# Patient Record
Sex: Male | Born: 1980 | Race: Black or African American | Hispanic: No | Marital: Married | Smoking: Never smoker
Health system: Southern US, Community
[De-identification: ages and names within clinical notes are randomized; demographics above are authoritative.]

---

## 2018-06-28 ENCOUNTER — Encounter (HOSPITAL_BASED_OUTPATIENT_CLINIC_OR_DEPARTMENT_OTHER): Payer: Self-pay | Admitting: Emergency Medicine

## 2018-06-28 ENCOUNTER — Emergency Department (HOSPITAL_BASED_OUTPATIENT_CLINIC_OR_DEPARTMENT_OTHER): Payer: BC Managed Care – PPO

## 2018-06-28 ENCOUNTER — Emergency Department (HOSPITAL_BASED_OUTPATIENT_CLINIC_OR_DEPARTMENT_OTHER)
Admission: EM | Admit: 2018-06-28 | Discharge: 2018-06-28 | Disposition: A | Discharge status: Home / Self Care | Payer: BC Managed Care – PPO | Attending: Emergency Medicine | Admitting: Emergency Medicine

## 2018-06-28 ENCOUNTER — Other Ambulatory Visit: Payer: Self-pay

## 2018-06-28 DIAGNOSIS — M7731 Calcaneal spur, right foot: Secondary | ICD-10-CM | POA: Diagnosis not present

## 2018-06-28 DIAGNOSIS — M7660 Achilles tendinitis, unspecified leg: Secondary | ICD-10-CM

## 2018-06-28 DIAGNOSIS — M25571 Pain in right ankle and joints of right foot: Secondary | ICD-10-CM | POA: Insufficient documentation

## 2018-06-28 NOTE — Discharge Instructions (Signed)
Wear your boot at all times except with bathing.  Please follow-up with the orthopedic doctor for further evaluation and treatment.  You can call to make an appointment on Monday.  Use ice 3-4 times daily alternating 20 minutes on, 20 minutes off.  Elevate your leg whenever you are not walking on it.  You can take ibuprofen and Tylenol as prescribed over-the-counter, as needed for your pain.

## 2018-06-28 NOTE — ED Triage Notes (Signed)
Pt c/o R ankle pain since Monday while playing basketball.

## 2018-06-28 NOTE — ED Notes (Signed)
ED Provider at bedside. 

## 2018-06-28 NOTE — ED Provider Notes (Signed)
[facility] EMERGENCY DEPARTMENT Provider Note   CSN: [id number] Arrival date & time: 06/28/18  1029     History   Chief Complaint Chief Complaint  Patient presents with  . Ankle Pain    HPI Charles Barrera is a 37 y.o. male who presents with right posterior ankle pain after playing basketball 6 days ago.  He reports that there was an acceleration injury.  He has been able to bear some weight and has had some improvement over the past 6 days.  He is been using his own crutches at home which has helped his discomfort.  Patient denies any numbness or tingling.  He denies any other injury or pain.  HPI  History reviewed. No pertinent past medical history.  There are no active problems to display for this patient.   History reviewed. No pertinent surgical history.      Home Medications    Prior to Admission medications   Not on File    Family History No family history on file.  Social History Social History   Tobacco Use  . Smoking status: Never Smoker  . Smokeless tobacco: Never Used  Substance Use Topics  . Alcohol use: Yes  . Drug use: Not Currently     Allergies   Patient has no known allergies.   Review of Systems Review of Systems  Musculoskeletal: Positive for arthralgias and joint swelling.  Neurological: Negative for numbness.     Physical Exam Updated Vital Signs BP (!) 128/91 (BP Location: Left Arm)   Pulse (!) 56   Temp 98.6 F (37 C) (Oral)   Resp 19   Ht 6\' 1"  (1.854 m)   Wt 124.7 kg (275 lb)   SpO2 98%   BMI 36.28 kg/m   Physical Exam  Constitutional: He appears well-developed and well-nourished. No distress.  HENT:  Head: Normocephalic and atraumatic.  Eyes: Conjunctivae are normal. Right eye exhibits no discharge. Left eye exhibits no discharge. No scleral icterus.  Neck: Normal range of motion. Neck supple.  Cardiovascular: Normal rate, regular rhythm, normal heart sounds and intact distal pulses. Exam reveals no  gallop and no friction rub.  No murmur heard. Pulmonary/Chest: Effort normal and breath sounds normal. No stridor. No respiratory distress. He has no wheezes. He has no rales.  Musculoskeletal: He exhibits no edema.  Tenderness at the insertion of the Achilles on the left, no deficit noted, Thompson test has normal motion of plantarflexion and produces some pain at the insertion of the Achilles; range of motion of toes intact; DP pulse intact, no malleoli tenderness   Neurological: He is alert. Coordination normal.  Skin: Skin is warm and dry. No rash noted. He is not diaphoretic. No pallor.  Psychiatric: He has a normal mood and affect.  Nursing note and vitals reviewed.    ED Treatments / Results  Labs (all labs ordered are listed, but only abnormal results are displayed) Labs Reviewed - No data to display  EKG None  Radiology Dg Ankle Complete Right  Result Date: 06/28/2018 CLINICAL DATA:  Right ankle pain for several days EXAM: RIGHT ANKLE - COMPLETE 3+ VIEW COMPARISON:  None. FINDINGS: Frontal, oblique, and lateral views were obtained. No evident fracture or joint effusion. No appreciable joint space narrowing or erosion. The ankle mortise appears intact. There is a prominent posterior calcaneal spur.  There is pes planus. IMPRESSION: No evident fracture.  Ankle mortise appears intact. Prominent posterior calcaneal spur.  There is pes planus. Electronically Signed  By: Bretta BangWilliam  Woodruff III M.D.   On: 06/28/2018 10:57    Procedures Procedures (including critical care time)  Medications Ordered in ED Medications - No data to display   Initial Impression / Assessment and Plan / ED Course  I have reviewed the triage vital signs and the nursing notes.  Pertinent labs & imaging results that were available during my care of the patient were reviewed by me and considered in my medical decision making (see chart for details).     Patient presenting with Achilles pain for 6 days.   X-ray of the ankle is negative, but does show a prominent posterior calcaneal spur and patient has tenderness over this area.  Low suspicion of complete Achilles rupture, but will place in cam walker boot and refer to orthopedics for further evaluation.  Patient has his own crutches with him.  Supportive treatment discussed including elevation, NSAIDs, ice.  Patient understands and agrees with plan.  Patient vitals stable throughout ED course and discharged in satisfactory condition.  Final Clinical Impressions(s) / ED Diagnoses   Final diagnoses:  Achilles tendon pain  Calcaneal spur of right foot    ED Discharge Orders    None       Emi HolesLaw, Jevaun Strick M, PA-C 06/28/18 1120    Vanetta MuldersZackowski, Scott, MD 06/28/18 [phone]

## 2018-07-19 ENCOUNTER — Other Ambulatory Visit: Payer: Self-pay

## 2018-07-19 ENCOUNTER — Encounter (HOSPITAL_BASED_OUTPATIENT_CLINIC_OR_DEPARTMENT_OTHER): Payer: Self-pay | Admitting: Emergency Medicine

## 2018-07-19 ENCOUNTER — Emergency Department (HOSPITAL_BASED_OUTPATIENT_CLINIC_OR_DEPARTMENT_OTHER): Payer: BC Managed Care – PPO

## 2018-07-19 ENCOUNTER — Emergency Department (HOSPITAL_BASED_OUTPATIENT_CLINIC_OR_DEPARTMENT_OTHER)
Admission: EM | Admit: 2018-07-19 | Discharge: 2018-07-19 | Disposition: A | Discharge status: Home / Self Care | Payer: BC Managed Care – PPO | Attending: Emergency Medicine | Admitting: Emergency Medicine

## 2018-07-19 DIAGNOSIS — R0789 Other chest pain: Secondary | ICD-10-CM | POA: Diagnosis present

## 2018-07-19 LAB — BASIC METABOLIC PANEL
ANION GAP: 9 (ref 5–15)
BUN: 13 mg/dL (ref 6–20)
CHLORIDE: 106 mmol/L (ref 98–111)
CO2: 26 mmol/L (ref 22–32)
CREATININE: 1.11 mg/dL (ref 0.61–1.24)
Calcium: 9 mg/dL (ref 8.9–10.3)
GFR calc non Af Amer: >60 mL/min (ref >60)
Glucose, Bld: 96 mg/dL (ref 70–99)
POTASSIUM: 3.8 mmol/L (ref 3.5–5.1)
SODIUM: 141 mmol/L (ref 135–145)

## 2018-07-19 LAB — CBC
HCT: 45.2 % (ref 39.0–52.0)
Hemoglobin: 15 g/dL (ref 13.0–17.0)
MCH: 28.8 pg (ref 26.0–34.0)
MCHC: 33.2 g/dL (ref 30.0–36.0)
MCV: 86.8 fL (ref 78.0–100.0)
PLATELETS: 191 10*3/uL (ref 150–400)
RBC: 5.21 MIL/uL (ref 4.22–5.81)
RDW: 13.4 % (ref 11.5–15.5)
WBC: 5.9 10*3/uL (ref 4.0–10.5)

## 2018-07-19 LAB — TROPONIN I

## 2018-07-19 MED ORDER — IBUPROFEN 600 MG PO TABS: 600.0000 mg | ORAL_TABLET | Freq: Three times a day (TID) | ORAL | 0 refills | Status: DC | PRN

## 2018-07-19 MED ORDER — IBUPROFEN 400 MG PO TABS
600.0000 mg | ORAL_TABLET | Freq: Once | ORAL | Status: AC
  Administered 2018-07-19: 600 mg via ORAL
  Filled 2018-07-19: qty 1

## 2018-07-19 NOTE — ED Triage Notes (Signed)
Patient states that he has had right sided chest pain since Thursday - denies any SOB - patient states that it hurts worse with movement

## 2018-07-19 NOTE — Discharge Instructions (Addendum)
If your chest pain worsens or does not improve, or you develop new or worsening symptoms such as fever, shortness of breath, vomiting or any other new/concerning symptoms and return to the ER for evaluation.

## 2018-07-19 NOTE — ED Provider Notes (Signed)
[facility] EMERGENCY DEPARTMENT Provider Note   CSN: [id number] Arrival date & time: 07/19/18  0808     History   Chief Complaint Chief Complaint  Patient presents with  . Chest Pain    HPI Charles Barrera is a 37 y.o. male.  HPI  37 year old male presents with chest pain.  Started 2 days ago.  It is essentially constant.  Feels like a tight and heaviness across his mid chest, a little bit more on the right side.  Worse with certain movements such as rolling in bed or sitting up.  May be a little bit better lying flat.  No pleuritic pain or leg swelling.  No shortness of breath.  Has tried some Bayer aspirin without relief.  Rates the pain is a 5/10. No recent illness.  History reviewed. No pertinent past medical history.  There are no active problems to display for this patient.   History reviewed. No pertinent surgical history.      Home Medications    Prior to Admission medications   Medication Sig Start Date End Date Taking? Authorizing Provider  ibuprofen (ADVIL,MOTRIN) 600 MG tablet Take 1 tablet (600 mg total) by mouth every 8 (eight) hours as needed. 07/19/18   Pricilla Loveless, MD    Family History History reviewed. No pertinent family history.  Social History Social History   Tobacco Use  . Smoking status: Never Smoker  . Smokeless tobacco: Never Used  Substance Use Topics  . Alcohol use: Yes  . Drug use: Not Currently     Allergies   Patient has no known allergies.   Review of Systems Review of Systems  Respiratory: Negative for shortness of breath.   Cardiovascular: Positive for chest pain. Negative for leg swelling.  Gastrointestinal: Negative for abdominal pain and vomiting.  All other systems reviewed and are negative.    Physical Exam Updated Vital Signs BP 116/87   Pulse (!) 56   Temp 98.6 F (37 C) (Oral)   Resp 14   Ht 6\' 1"  (1.854 m)   Wt 124.7 kg (275 lb)   SpO2 99%   BMI 36.28 kg/m   Physical Exam    Constitutional: He is oriented to person, place, and time. He appears well-developed and well-nourished.  HENT:  Head: Normocephalic and atraumatic.  Right Ear: External ear normal.  Left Ear: External ear normal.  Nose: Nose normal.  Eyes: Right eye exhibits no discharge. Left eye exhibits no discharge.  Neck: Neck supple.  Cardiovascular: Normal rate, regular rhythm and normal heart sounds.  Pulmonary/Chest: Effort normal and breath sounds normal. He exhibits tenderness (mild).    Abdominal: Soft. There is no tenderness.  Musculoskeletal: He exhibits no edema.  Neurological: He is alert and oriented to person, place, and time.  Skin: Skin is warm and dry.  Nursing note and vitals reviewed.    ED Treatments / Results  Labs (all labs ordered are listed, but only abnormal results are displayed) Labs Reviewed  BASIC METABOLIC PANEL  CBC  TROPONIN I    EKG EKG Interpretation  Date/Time:  Saturday July 19 2018 08:19:04 EDT Ventricular Rate:  73 PR Interval:    QRS Duration: 92 QT Interval:  389 QTC Calculation: 429 R Axis:   53 Text Interpretation:  Sinus rhythm no acute ST/T changes No old tracing to compare Confirmed by Pricilla Loveless [phone]) on 07/19/2018 8:23:27 AM   Radiology Dg Chest 2 View  Result Date: 07/19/2018 CLINICAL DATA:  Central chest pain for  several days EXAM: CHEST - 2 VIEW COMPARISON:  None. FINDINGS: The heart size and mediastinal contours are within normal limits. Both lungs are clear. The visualized skeletal structures are unremarkable. IMPRESSION: No active cardiopulmonary disease. Electronically Signed   By: Alcide CleverMark  Lukens M.D.   On: 07/19/2018 08:33    Procedures Procedures (including critical care time)    EMERGENCY DEPARTMENT US CARDIAC EXAM "Study: Limited Ultrasound of the Heart and Pericardium"  INDICATIONS:Cardiac arrest Multiple views of the heart and pericardium were obtained in real-time with a multi-frequency probe.  PERFORMED  EA:[id number]:Myself IMAGES ARCHIVED?: Yes LIMITATIONS:  Body habitus VIEWS USED: Parasternal long axis INTERPRETATION: Cardiac activity present, Pericardial effusioin absent and Cardiac tamponade absent   Medications Ordered in ED Medications  ibuprofen (ADVIL,MOTRIN) tablet 600 mg (600 mg Oral Given 07/19/18 0936)     Initial Impression / Assessment and Plan / ED Course  I have reviewed the triage vital signs and the nursing notes.  Pertinent labs & imaging results that were available during my care of the patient were reviewed by me and considered in my medical decision making (see chart for details).     ECG, chest x-ray and labs are reassuring.  I highly doubt ACS, PE, dissection.  He is low risk for PE and PERC neg he does haveative.  Pain that is worse with sitting up and some movements and could be pericarditis although is probably much more likely costochondritis.  Bedside echo does not show any obvious effusion though was limited.  I think is reasonable to treat with ibuprofen and follow-up with a PCP.  Discussed return precautions.  Final Clinical Impressions(s) / ED Diagnoses   Final diagnoses:  Chest wall pain    ED Discharge Orders        Ordered    ibuprofen (ADVIL,MOTRIN) 600 MG tablet  Every 8 hours PRN     07/19/18 1009       Pricilla LovelessGoldston, Yezenia Fredrick, MD 07/19/18 1031

## 2020-04-17 IMAGING — CR DG CHEST 2V
2 series · 2 of 2 positions shown · non-contrast
Comparison: None.

CLINICAL DATA: Central chest pain for several days

EXAM:
CHEST - 2 VIEW

[w chest pa]
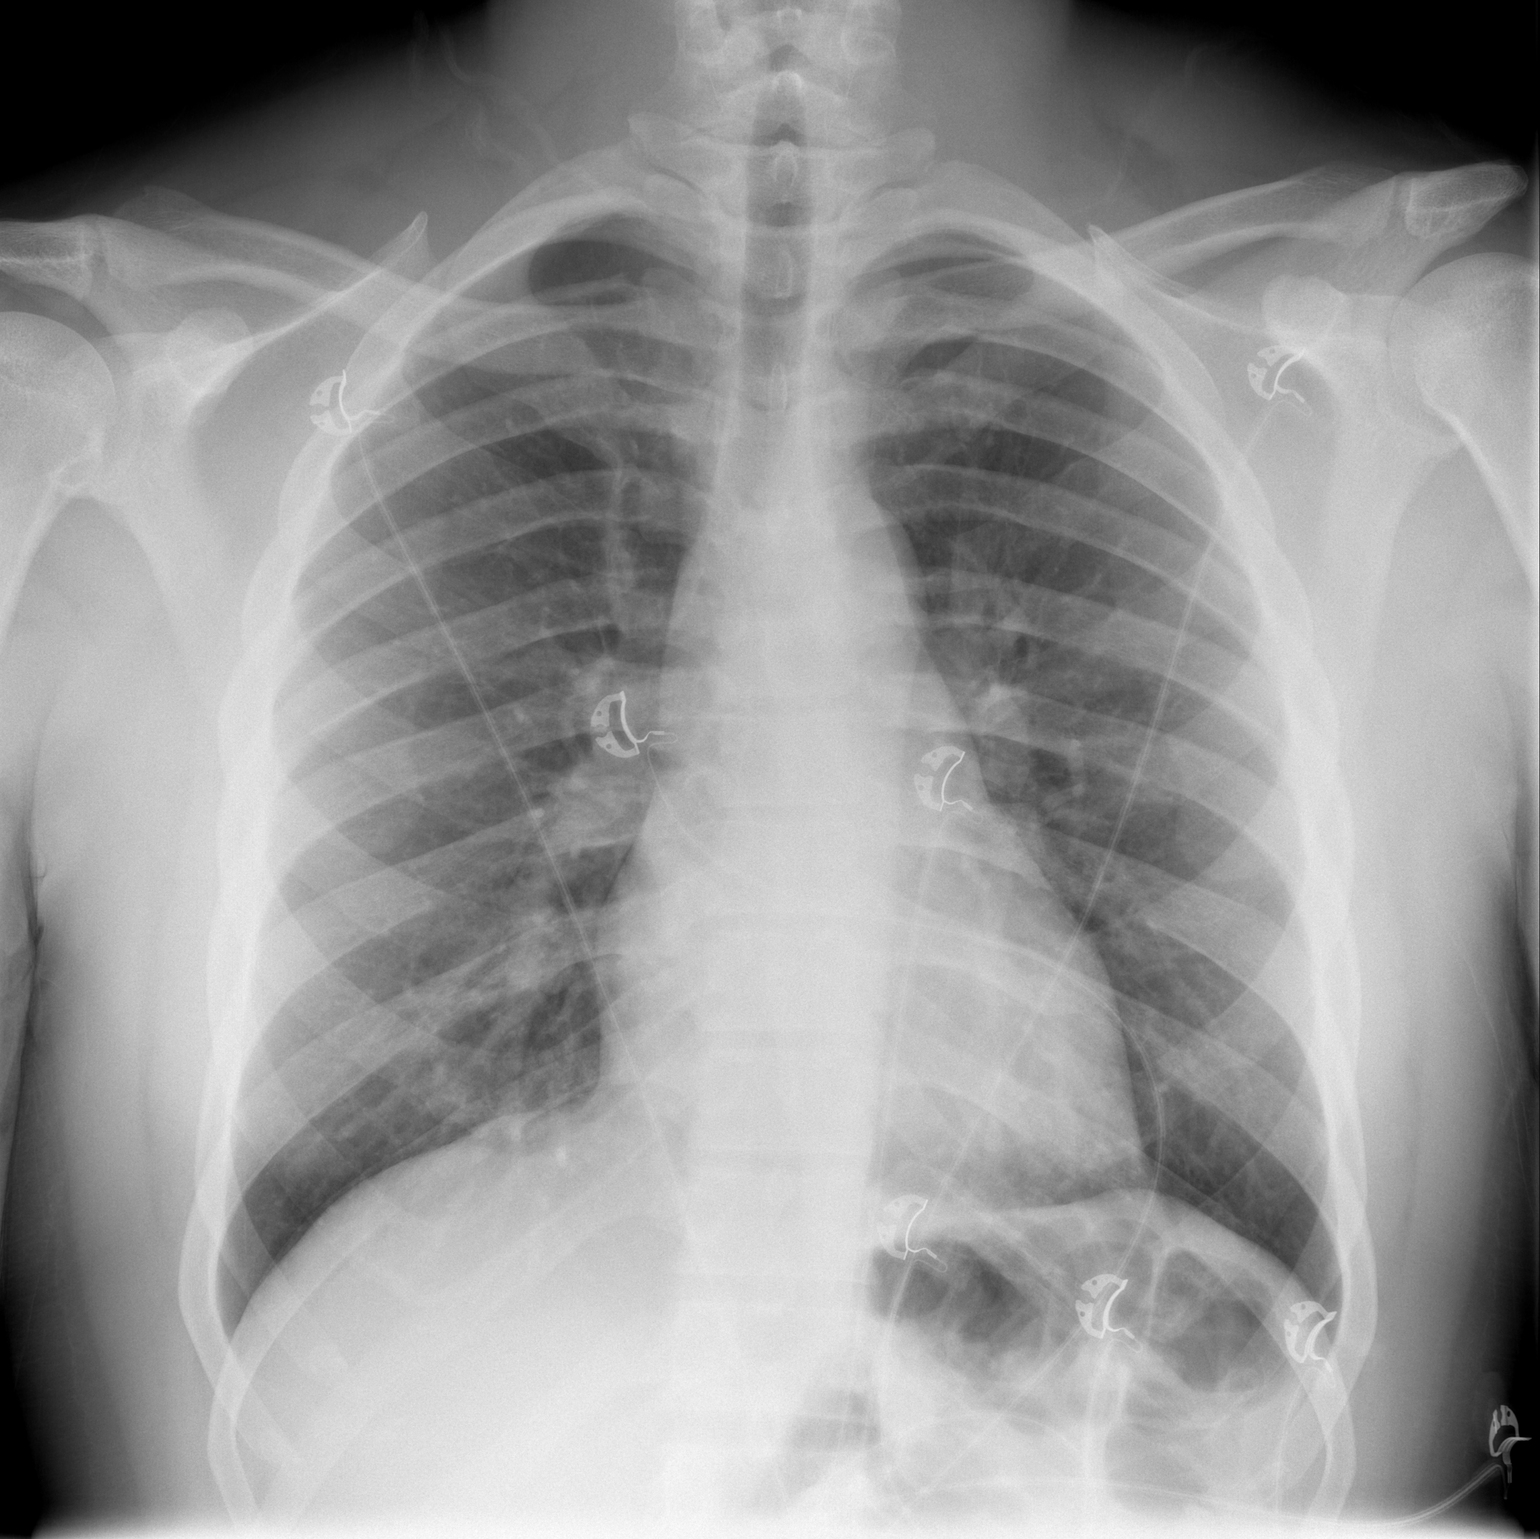

[w chest lat]
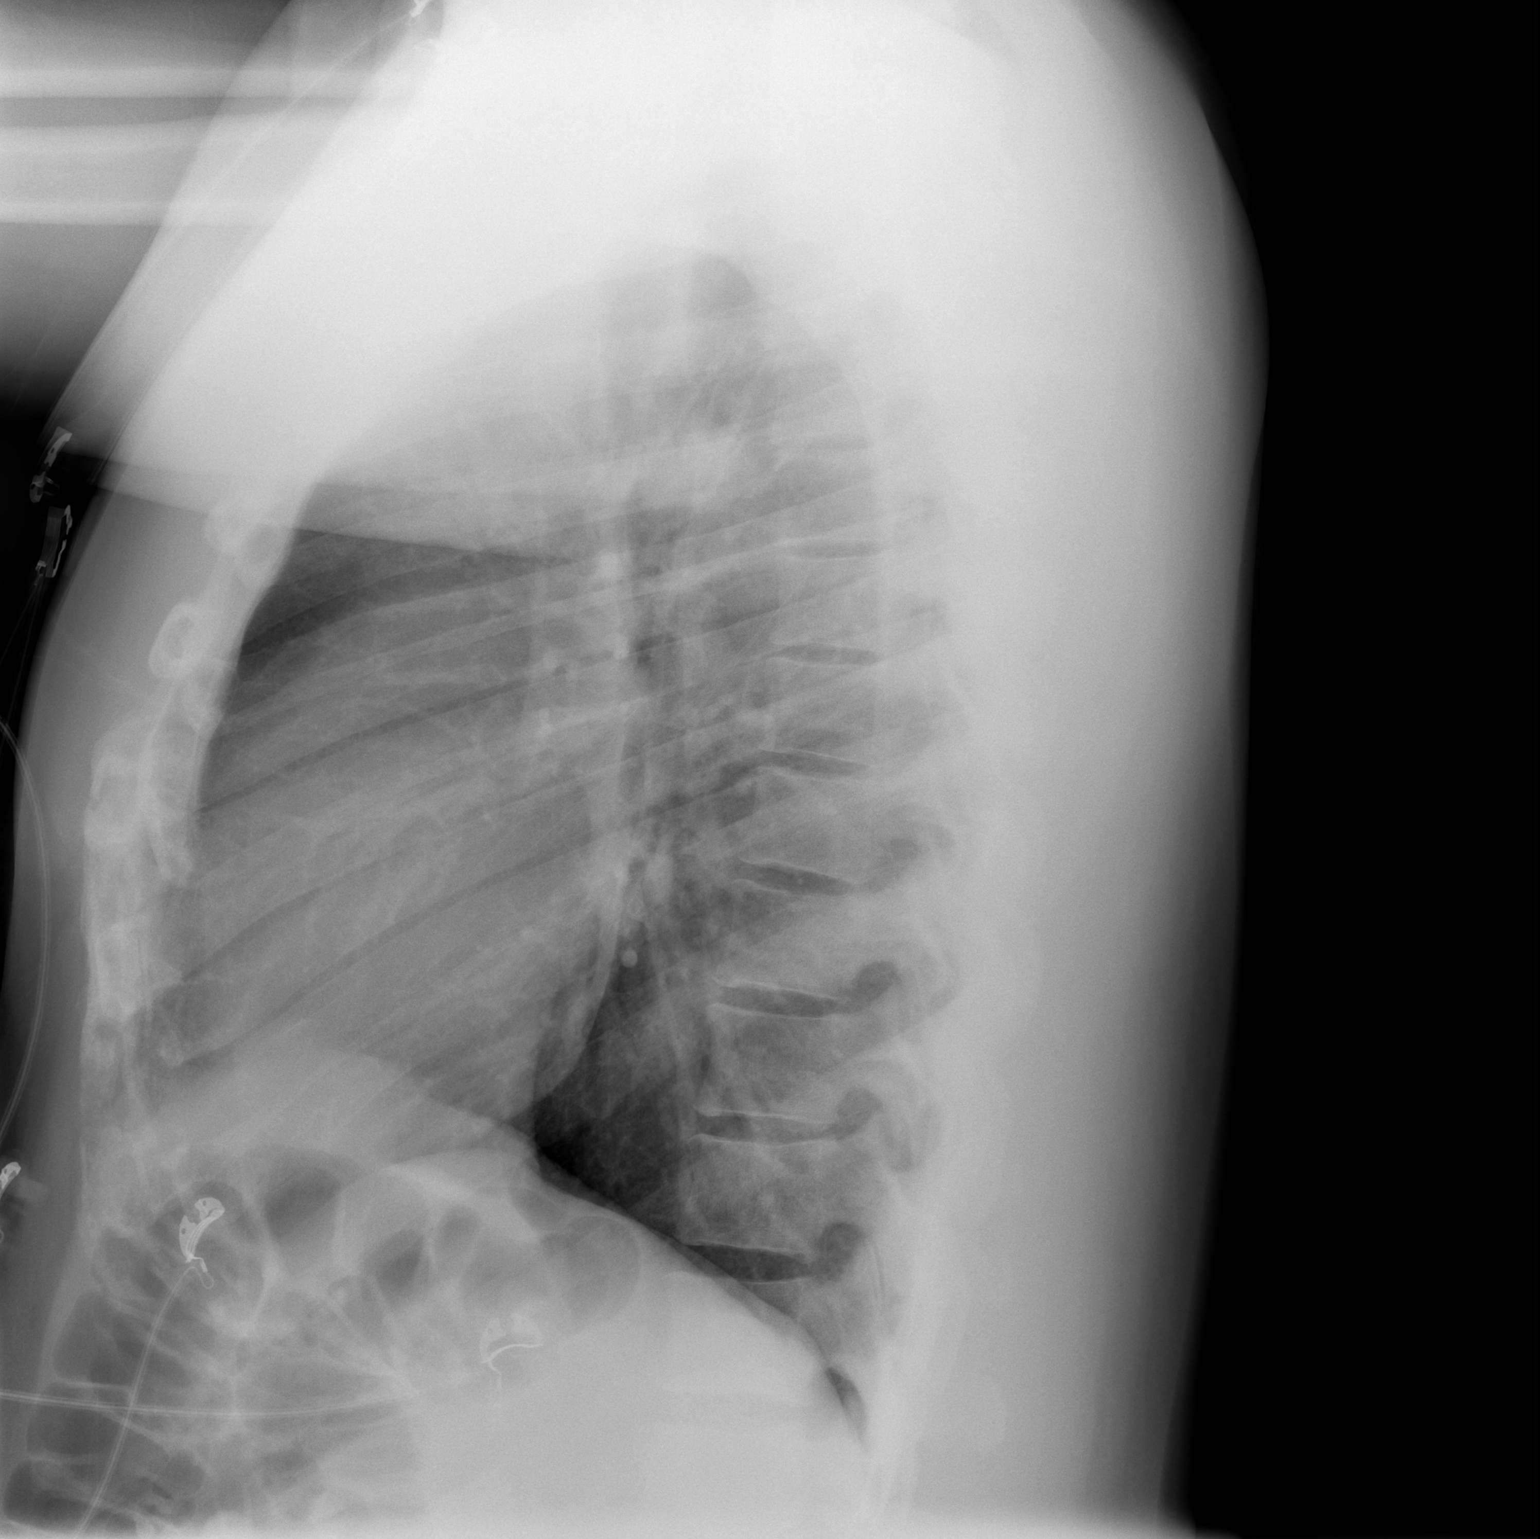

[2 of 2 positions shown; findings below may reference images not displayed]

FINDINGS: The heart size and mediastinal contours are within normal limits.
Both lungs are clear. The visualized skeletal structures are
unremarkable.
IMPRESSION: No active cardiopulmonary disease.

## 2023-10-23 ENCOUNTER — Encounter (HOSPITAL_COMMUNITY): Payer: Self-pay | Admitting: Family Medicine

## 2023-10-23 ENCOUNTER — Emergency Department (HOSPITAL_BASED_OUTPATIENT_CLINIC_OR_DEPARTMENT_OTHER): Payer: BC Managed Care – PPO

## 2023-10-23 ENCOUNTER — Other Ambulatory Visit: Payer: Self-pay

## 2023-10-23 ENCOUNTER — Inpatient Hospital Stay (HOSPITAL_BASED_OUTPATIENT_CLINIC_OR_DEPARTMENT_OTHER)
Admission: EM | Admit: 2023-10-23 | Discharge: 2023-10-28 | DRG: 519 | Disposition: A | Discharge status: Home / Self Care | Payer: BC Managed Care – PPO | Attending: Internal Medicine | Admitting: Internal Medicine

## 2023-10-23 DIAGNOSIS — R933 Abnormal findings on diagnostic imaging of other parts of digestive tract: Secondary | ICD-10-CM | POA: Diagnosis present

## 2023-10-23 DIAGNOSIS — G9529 Other cord compression: Secondary | ICD-10-CM | POA: Diagnosis present

## 2023-10-23 DIAGNOSIS — G9520 Unspecified cord compression: Principal | ICD-10-CM | POA: Diagnosis present

## 2023-10-23 DIAGNOSIS — Z6834 Body mass index (BMI) 34.0-34.9, adult: Secondary | ICD-10-CM

## 2023-10-23 DIAGNOSIS — C7951 Secondary malignant neoplasm of bone: Principal | ICD-10-CM | POA: Diagnosis present

## 2023-10-23 DIAGNOSIS — M899 Disorder of bone, unspecified: Secondary | ICD-10-CM

## 2023-10-23 DIAGNOSIS — M549 Dorsalgia, unspecified: Secondary | ICD-10-CM | POA: Diagnosis present

## 2023-10-23 DIAGNOSIS — R262 Difficulty in walking, not elsewhere classified: Secondary | ICD-10-CM | POA: Diagnosis present

## 2023-10-23 DIAGNOSIS — R531 Weakness: Secondary | ICD-10-CM | POA: Diagnosis present

## 2023-10-23 DIAGNOSIS — G9589 Other specified diseases of spinal cord: Secondary | ICD-10-CM | POA: Diagnosis present

## 2023-10-23 DIAGNOSIS — C801 Malignant (primary) neoplasm, unspecified: Secondary | ICD-10-CM | POA: Diagnosis present

## 2023-10-23 DIAGNOSIS — M898X9 Other specified disorders of bone, unspecified site: Secondary | ICD-10-CM

## 2023-10-23 DIAGNOSIS — E669 Obesity, unspecified: Secondary | ICD-10-CM | POA: Diagnosis present

## 2023-10-23 DIAGNOSIS — Z79899 Other long term (current) drug therapy: Secondary | ICD-10-CM | POA: Diagnosis not present

## 2023-10-23 LAB — BASIC METABOLIC PANEL
Anion gap: 9 (ref 5–15)
BUN: 10 mg/dL (ref 6–20)
CO2: 29 mmol/L (ref 22–32)
Calcium: 9.1 mg/dL (ref 8.9–10.3)
Chloride: 101 mmol/L (ref 98–111)
Creatinine, Ser: 1.08 mg/dL (ref 0.61–1.24)
GFR, Estimated: >60 mL/min (ref >60)
Glucose, Bld: 91 mg/dL (ref 70–99)
Potassium: 3.5 mmol/L (ref 3.5–5.1)
Sodium: 139 mmol/L (ref 135–145)

## 2023-10-23 LAB — CBC WITH DIFFERENTIAL/PLATELET
Abs Immature Granulocytes: 0.01 10*3/uL (ref 0.00–0.07)
Basophils Absolute: 0 10*3/uL (ref 0.0–0.1)
Basophils Relative: 0 %
Eosinophils Absolute: 0.1 10*3/uL (ref 0.0–0.5)
Eosinophils Relative: 1 %
HCT: 42.8 % (ref 39.0–52.0)
Hemoglobin: 14 g/dL (ref 13.0–17.0)
Immature Granulocytes: 0 %
Lymphocytes Relative: 37 %
Lymphs Abs: 1.9 10*3/uL (ref 0.7–4.0)
MCH: 28.5 pg (ref 26.0–34.0)
MCHC: 32.7 g/dL (ref 30.0–36.0)
MCV: 87.2 fL (ref 80.0–100.0)
Monocytes Absolute: 0.4 10*3/uL (ref 0.1–1.0)
Monocytes Relative: 7 %
Neutro Abs: 2.9 10*3/uL (ref 1.7–7.7)
Neutrophils Relative %: 55 %
Platelets: 203 10*3/uL (ref 150–400)
RBC: 4.91 MIL/uL (ref 4.22–5.81)
RDW: 13 % (ref 11.5–15.5)
WBC: 5.3 10*3/uL (ref 4.0–10.5)
nRBC: 0 % (ref 0.0–0.2)

## 2023-10-23 MED ORDER — ACETAMINOPHEN 650 MG RE SUPP: 650.0000 mg | Freq: Four times a day (QID) | RECTAL | Status: DC | PRN

## 2023-10-23 MED ORDER — METHOCARBAMOL 1000 MG/10ML IJ SOLN
500.0000 mg | Freq: Four times a day (QID) | INTRAMUSCULAR | Status: DC | PRN
  Administered 2023-10-26: 500 mg via INTRAVENOUS
  Filled 2023-10-23 (×2): qty 5

## 2023-10-23 MED ORDER — ONDANSETRON HCL 4 MG PO TABS: 4.0000 mg | ORAL_TABLET | Freq: Four times a day (QID) | ORAL | Status: DC | PRN

## 2023-10-23 MED ORDER — OXYCODONE HCL 5 MG PO TABS
5.0000 mg | ORAL_TABLET | ORAL | Status: DC | PRN
  Administered 2023-10-26 – 2023-10-27 (×3): 5 mg via ORAL
  Filled 2023-10-23 (×4): qty 1

## 2023-10-23 MED ORDER — ONDANSETRON HCL 4 MG/2ML IJ SOLN: 4.0000 mg | Freq: Four times a day (QID) | INTRAMUSCULAR | Status: DC | PRN

## 2023-10-23 MED ORDER — GADOBUTROL 1 MMOL/ML IV SOLN
10.0000 mL | Freq: Once | INTRAVENOUS | Status: AC | PRN
  Administered 2023-10-23: 10 mL via INTRAVENOUS

## 2023-10-23 MED ORDER — BISACODYL 5 MG PO TBEC: 5.0000 mg | DELAYED_RELEASE_TABLET | Freq: Every day | ORAL | Status: DC | PRN

## 2023-10-23 MED ORDER — SENNOSIDES-DOCUSATE SODIUM 8.6-50 MG PO TABS: 1.0000 | ORAL_TABLET | Freq: Every evening | ORAL | Status: DC | PRN

## 2023-10-23 MED ORDER — ACETAMINOPHEN 325 MG PO TABS
650.0000 mg | ORAL_TABLET | Freq: Four times a day (QID) | ORAL | Status: DC | PRN
  Filled 2023-10-23: qty 2

## 2023-10-23 MED ORDER — HYDROMORPHONE HCL 1 MG/ML IJ SOLN
0.5000 mg | INTRAMUSCULAR | Status: DC | PRN
  Administered 2023-10-25 – 2023-10-26 (×2): 0.5 mg via INTRAVENOUS
  Filled 2023-10-23 (×2): qty 0.5

## 2023-10-23 NOTE — ED Notes (Signed)
Spoke with Greggory Stallion at [facility] regarding Hospitalist consult.

## 2023-10-23 NOTE — ED Notes (Signed)
[facility] reached out for report to transport patient  Called @ 19:33 ED Nurse will call floor for report

## 2023-10-23 NOTE — H&P (Signed)
History and Physical    Charles Barrera RUE:[id number] DOB: August 08, 1981 DOA: 10/23/2023  PCP: Caffie Damme, MD   Patient coming from: Home   Chief Complaint: Back pain, weakness   HPI: Charles Barrera is a 42 y.o. male who denies any significant past medical history and presents for evaluation of back pain and weakness.  Patient noted development of atraumatic mid back pain in February 2024.  This has progressively worsened and he began developing bilateral lower extremity numbness and weakness in September.  He also has some paresthesias involving his abdominal wall.  [facility] ED Course: Upon arrival to the ED, patient is found to be afebrile and saturating well on room air with normal heart rate and stable blood pressure.  BMP and CBC are unremarkable.  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  Neurosurgery (Dr. Maisie Fus) and oncology (Dr. Pamelia Hoit) were consulted by the ED PA and the patient was transferred to [facility] for admission.  Review of Systems:  All other systems reviewed and apart from HPI, are negative.  History reviewed. No pertinent past medical history.  No past surgical history on file.  Social History:   reports that he has never smoked. He has never used smokeless tobacco. He reports current alcohol use. He reports that he does not currently use drugs.  No Known Allergies  No family history on file.   Prior to Admission medications   Medication Sig Start Date End Date Taking? Authorizing Provider  ibuprofen (ADVIL,MOTRIN) 600 MG tablet Take 1 tablet (600 mg total) by mouth every 8 (eight) hours as needed. 07/19/18   Pricilla Loveless, MD    Physical Exam: Vitals:   10/23/23 2000 10/23/23 2001 10/23/23 2011 10/23/23 2057  BP: 109/78   135/89  Pulse: 63   68  Resp: 16   18  Temp:  98.7 F (37.1  C) 98.7 F (37.1 C) (!) 97.4 F (36.3 C)  TempSrc:  Oral Oral Oral  SpO2: 98%   96%  Weight:      Height:        Constitutional: NAD, calm  Eyes: PERTLA, lids and conjunctivae normal ENMT: Mucous membranes are moist. Posterior pharynx clear of any exudate or lesions.   Neck: supple, no masses  Respiratory: no wheezing, no crackles. No accessory muscle use.  Cardiovascular: S1 & S2 heard, regular rate and rhythm. No extremity edema.  Abdomen: No distension, no tenderness, soft. Bowel sounds active.  Musculoskeletal: no clubbing / cyanosis. No joint deformity upper and lower extremities.   Skin: no significant rashes, lesions, ulcers. Warm, dry, well-perfused. Neurologic: CN 2-12 grossly intact. Sensation to light touch diminished in b/l LEs. Strength 5/5 in all 4 limbs. Alert and oriented.  Psychiatric: Pleasant. Cooperative.    Labs and Imaging on Admission: I have personally reviewed following labs and imaging studies  CBC: Recent Labs  Lab 10/23/23 1434  WBC 5.3  NEUTROABS 2.9  HGB 14.0  HCT 42.8  MCV 87.2  PLT 203   Basic Metabolic Panel: Recent Labs  Lab 10/23/23 1434  NA 139  K 3.5  CL 101  CO2 29  GLUCOSE 91  BUN 10  CREATININE 1.08  CALCIUM 9.1   GFR: Estimated Creatinine Clearance: 124 mL/min (by C-G formula based on SCr of 1.08 mg/dL). Liver Function Tests: No results for input(s): "AST", "ALT", "ALKPHOS", "BILITOT", "PROT", "  ALBUMIN" in the last 168 hours. No results for input(s): "LIPASE", "AMYLASE" in the last 168 hours. No results for input(s): "AMMONIA" in the last 168 hours. Coagulation Profile: No results for input(s): "INR", "PROTIME" in the last 168 hours. Cardiac Enzymes: No results for input(s): "CKTOTAL", "CKMB", "CKMBINDEX", "TROPONINI" in the last 168 hours. BNP (last 3 results) No results for input(s): "PROBNP" in the last 8760 hours. HbA1C: No results for input(s): "HGBA1C" in the last 72 hours. CBG: No results for input(s):  "GLUCAP" in the last 168 hours. Lipid Profile: No results for input(s): "CHOL", "HDL", "LDLCALC", "TRIG", "CHOLHDL", "LDLDIRECT" in the last 72 hours. Thyroid Function Tests: No results for input(s): "TSH", "T4TOTAL", "FREET4", "T3FREE", "THYROIDAB" in the last 72 hours. Anemia Panel: No results for input(s): "VITAMINB12", "FOLATE", "FERRITIN", "TIBC", "IRON", "RETICCTPCT" in the last 72 hours. Urine analysis: No results found for: "COLORURINE", "APPEARANCEUR", "LABSPEC", "PHURINE", "GLUCOSEU", "HGBUR", "BILIRUBINUR", "KETONESUR", "PROTEINUR", "UROBILINOGEN", "NITRITE", "LEUKOCYTESUR" Sepsis Labs: @LABRCNTIP (procalcitonin:4,lacticidven:4) )No results found for this or any previous visit (from the past 240 hour(s)).   Radiological Exams on Admission: MR Lumbar Spine W Wo Contrast  Result Date: 10/23/2023 CLINICAL DATA:  Myelopathy.  Weakness of the lower extremities. EXAM: MRI LUMBAR SPINE WITHOUT AND WITH CONTRAST TECHNIQUE: Multiplanar and multiecho pulse sequences of the lumbar spine were obtained without and with intravenous contrast. CONTRAST:  10mL GADAVIST GADOBUTROL 1 MMOL/ML IV SOLN COMPARISON:  None Available. FINDINGS: Segmentation:  5 lumbar type vertebral bodies. Alignment:  Normal Vertebrae: Metastatic disease affecting all the vertebral bodies throughout the region as well as the sacrum in the iliac bones. No evidence of regional pathologic fracture. No extraosseous tumor in this region. Conus medullaris and cauda equina: Conus extends to the T12 level. Conus and cauda equina appear normal. Paraspinal and other soft tissues: No abnormality seen. Disc levels: Mild non-compressive disc bulges at L4-5 and L5-S1. IMPRESSION: 1. Metastatic disease affecting all the vertebral bodies throughout the region as well as the sacrum and iliac bones. No evidence of regional pathologic fracture. No extraosseous tumor in this region. 2. Mild non-compressive disc bulges at L4-5 and L5-S1. 3. Critical  Value/emergent results were called by telephone at the time of interpretation on 10/23/2023 at 4:51 pm to provider [facility] , who verbally acknowledged these results. Electronically Signed   By: Paulina Fusi M.D.   On: 10/23/2023 16:53   MR THORACIC SPINE W WO CONTRAST  Result Date: 10/23/2023 CLINICAL DATA:  Myelopathy, acute. Bilateral lower extremity weakness. Balance disturbance. Symptoms began over the last few months. EXAM: MRI THORACIC WITHOUT AND WITH CONTRAST TECHNIQUE: Multiplanar and multiecho pulse sequences of the thoracic spine were obtained without and with intravenous contrast. CONTRAST:  10mL GADAVIST GADOBUTROL 1 MMOL/ML IV SOLN COMPARISON:  None Available. FINDINGS: Alignment:  No malalignment. Vertebrae: There is widespread metastatic disease or myeloma throughout the spine. Virtually every level is involved. There is no evidence of regional pathologic fracture. There is an intraspinal mass which appears to be extradural extending from mid T4 to mid T6 in the posterior right side of the spinal canal. This displaces the spinal cord and could be compressing the cord. No other extraosseous tumor seen in the region. Paraspinal and other soft tissues: No para spinous lesion identified. Disc levels: No evidence of disc level pathology. No degenerative disease or herniation. IMPRESSION: 1. 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 Virtually every level is involved. No evidence of regional pathologic fracture. 2. Extraosseous mass in the posterior right side of the spinal  canal extending from mid T4 to mid T6. This displaces the spinal cord and could be compressing the cord. 3. Call report in progress. Electronically Signed   By: Paulina Fusi M.D.   On: 10/23/2023 16:48     Assessment/Plan  1. Metastatic disease; spinal cord compression - Presents with progressive back pain and b/l LE weakness and has MRI findings concerning for metastatic disease leading to spinal  cord compression  - Follow-up neurosurgery and oncology recommendations     DVT prophylaxis: SCDs  Code Status: Full  Level of Care: Level of care: Med-Surg Family Communication: None present   Disposition Plan:  Patient is from: home  Anticipated d/c is to: TBD Anticipated d/c date is: 10/26/23  Patient currently: Pending neurosurgery and oncology consultations  Consults called: Neurosurgery, oncology  Admission status: Inpatient     Briscoe Deutscher, MD [facility]  10/23/2023, 10:05 PM

## 2023-10-23 NOTE — ED Triage Notes (Signed)
C/O back pain since earlier this year; and has been having walking and balance issue since September. Stated he has seen his PCP for same issue and has had imaging but no answers as to what is causing his pain, walking and balance issues.

## 2023-10-23 NOTE — Plan of Care (Signed)

## 2023-10-23 NOTE — ED Provider Notes (Signed)
[facility] Provider Note   CSN: [id number] Arrival date & time: 10/23/23  1358     History  Chief Complaint  Patient presents with   Back Pain    Charles Barrera is a 42 y.o. male.  Patient with no symptom past medical history started having back pain in about February of this year.  After several months he went to see the primary care physician.  He had x-rays performed per his report, that were negative.  He was just taking over-the-counter medications for symptoms.  In September patient started noticing some weakness in his legs bilaterally and some trouble walking.  He went and saw PCP who ordered an MRI (findings as below).  He states that they were concerned about multiple myeloma and ordered lab work which came back as negative.  He states that he also had a CT scan of his body which he reported coming back normal.  He was sent to see a spine doctor and then referred on to a surgeon.  He had that appointment yesterday.  He states that the surgeon stated that because they did not have a definitive diagnosis, that there was nothing that they could do for him immediately.  He presents to the emergency department today needing some answers.  He is concerned that he will end up needing wheelchair because of the progressive nature of his symptoms.  MRI findings 1.  Diffusely abnormal bone marrow in the lumbar spine, sacrum, and imaged bony pelvis with multifocal marrow replacing lesions. Findings raise concern for malignancy which may represent include metastatic disease, myeloma, or other hematologic malignancy. Recommend appropriate laboratory evaluation and further evaluation with CT of the chest, abdomen, and pelvis with contrast.  2.  No findings to suggest pathologic fracture or epidural spread of tumor within limitations of noncontrast exam.  3.  No high grade canal or foraminal stenosis within the lumbar spine.  4.  Mild degenerative disc  disease at L5-S1 with disc bulge and facet hypertrophy contributing to mild narrowing of the subarticular recesses and neural foramina. Disc material may contact the traversing S1 nerve roots without displacement.   These findings were discussed with Pasty Spillers (assistant to Dr. Caffie Damme) by Dr. Chelsea Aus via telephone (facilitated via the radiology critical value line) on 09/28/2023 3:41 PM.         Home Medications Prior to Admission medications   Medication Sig Start Date End Date Taking? Authorizing Provider  ibuprofen (ADVIL,MOTRIN) 600 MG tablet Take 1 tablet (600 mg total) by mouth every 8 (eight) hours as needed. 07/19/18   Pricilla Loveless, MD      Allergies    Patient has no known allergies.    Review of Systems   Review of Systems  Physical Exam Updated Vital Signs BP (!) 141/94 (BP Location: Left Arm)   Pulse 64   Temp 97.7 F (36.5 C)   Resp 18   Ht 6\' 2"  (1.88 m)   Wt 120.2 kg   SpO2 99%   BMI 34.02 kg/m   Physical Exam Vitals and nursing note reviewed.  Constitutional:      General: He is not in acute distress.    Appearance: He is well-developed.  HENT:     Head: Normocephalic and atraumatic.  Eyes:     General:        Right eye: No discharge.        Left eye: No discharge.     Conjunctiva/sclera: Conjunctivae  normal.  Cardiovascular:     Rate and Rhythm: Normal rate and regular rhythm.     Heart sounds: Normal heart sounds.  Pulmonary:     Effort: Pulmonary effort is normal.     Breath sounds: Normal breath sounds.  Abdominal:     Palpations: Abdomen is soft.     Tenderness: There is no abdominal tenderness.  Musculoskeletal:     Cervical back: Normal range of motion and neck supple. No tenderness. Normal range of motion.     Thoracic back: No tenderness.     Lumbar back: No tenderness.  Skin:    General: Skin is warm and dry.  Neurological:     Mental Status: He is alert.     Cranial Nerves: Cranial nerves 2-12 are intact.     Sensory:  Sensation is intact.     Motor: Weakness present.     Deep Tendon Reflexes:     Reflex Scores:      Patellar reflexes are 0 on the right side and 0 on the left side.    Comments: Trace weakness lower extremities bilaterally, he has mild difficulty observed lifting legs into the bed when he lies from a sitting position on the bed side.      ED Results / Procedures / Treatments   Labs (all labs ordered are listed, but only abnormal results are displayed) Labs Reviewed  CBC WITH DIFFERENTIAL/PLATELET  BASIC METABOLIC PANEL    EKG None  Radiology MR Lumbar Spine W Wo Contrast  Result Date: 10/23/2023 CLINICAL DATA:  Myelopathy.  Weakness of the lower extremities. EXAM: MRI LUMBAR SPINE WITHOUT AND WITH CONTRAST TECHNIQUE: Multiplanar and multiecho pulse sequences of the lumbar spine were obtained without and with intravenous contrast. CONTRAST:  10mL GADAVIST GADOBUTROL 1 MMOL/ML IV SOLN COMPARISON:  None Available. FINDINGS: Segmentation:  5 lumbar type vertebral bodies. Alignment:  Normal Vertebrae: Metastatic disease affecting all the vertebral bodies throughout the region as well as the sacrum in the iliac bones. No evidence of regional pathologic fracture. No extraosseous tumor in this region. Conus medullaris and cauda equina: Conus extends to the T12 level. Conus and cauda equina appear normal. Paraspinal and other soft tissues: No abnormality seen. Disc levels: Mild non-compressive disc bulges at L4-5 and L5-S1. IMPRESSION: 1. Metastatic disease affecting all the vertebral bodies throughout the region as well as the sacrum and iliac bones. No evidence of regional pathologic fracture. No extraosseous tumor in this region. 2. Mild non-compressive disc bulges at L4-5 and L5-S1. 3. Critical Value/emergent results were called by telephone at the time of interpretation on 10/23/2023 at 4:51 pm to provider [facility] , who verbally acknowledged these results. Electronically Signed   By: Paulina Fusi M.D.   On: 10/23/2023 16:53   MR THORACIC SPINE W WO CONTRAST  Result Date: 10/23/2023 CLINICAL DATA:  Myelopathy, acute. Bilateral lower extremity weakness. Balance disturbance. Symptoms began over the last few months. EXAM: MRI THORACIC WITHOUT AND WITH CONTRAST TECHNIQUE: Multiplanar and multiecho pulse sequences of the thoracic spine were obtained without and with intravenous contrast. CONTRAST:  10mL GADAVIST GADOBUTROL 1 MMOL/ML IV SOLN COMPARISON:  None Available. FINDINGS: Alignment:  No malalignment. Vertebrae: There is widespread metastatic disease or myeloma throughout the spine. Virtually every level is involved. There is no evidence of regional pathologic fracture. There is an intraspinal mass which appears to be extradural extending from mid T4 to mid T6 in the posterior right side of the spinal canal. This displaces the spinal  cord and could be compressing the cord. No other extraosseous tumor seen in the region. Paraspinal and other soft tissues: No para spinous lesion identified. Disc levels: No evidence of disc level pathology. No degenerative disease or herniation. IMPRESSION: 1. 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 Virtually every level is involved. No evidence of regional pathologic fracture. 2. Extraosseous mass in the posterior right side of the spinal canal extending from mid T4 to mid T6. This displaces the spinal cord and could be compressing the cord. 3. Call report in progress. Electronically Signed   By: Paulina Fusi M.D.   On: 10/23/2023 16:48    Procedures Procedures    Medications Ordered in ED Medications  gadobutrol (GADAVIST) 1 MMOL/ML injection 10 mL (10 mLs Intravenous Contrast Given 10/23/23 1604)    ED Course/ Medical Decision Making/ A&P    Patient seen and examined. History obtained directly from patient. Reviewed available information in epic which is sparse.  Did review MRI results.  Imaging is not available.  Discussed  with Dr. Suezanne Jacquet. Will obtain MRI thoracic and lumbar spine w/wo contrast.   Labs/EKG: Ordered CBC and BMP.  Imaging: Ordered MRI  Medications/Fluids: None ordered  Most recent vital signs reviewed and are as follows: BP (!) 141/94 (BP Location: Left Arm)   Pulse 64   Temp 97.7 F (36.5 C)   Resp 18   Ht 6\' 2"  (1.88 m)   Wt 120.2 kg   SpO2 99%   BMI 34.02 kg/m   Initial impression: Progressive lower extremity weakness  5:56 PM Reassessment performed. Patient appears stable.  I discussed results of MRI with Dr. Karin Golden of radiology.  Recommends neurosurgical consultation.  I discussed case with neurosurgery Esperanza Richters. Plan to admit to medicine, NSG consult.  Reccs heme/onc consult.   Paged heme/onc, spoke with Dr. Pamelia Hoit. Heme/onc to consult.   Paged hospitalist.   Labs personally reviewed and interpreted including: CBC and BMP were normal.  Imaging personally visualized and interpreted including: MRI, t-spine mass, bony disease appreciated.   Reviewed pertinent lab work and imaging with patient at bedside. Questions answered.   Most current vital signs reviewed and are as follows: BP (!) 141/94 (BP Location: Left Arm)   Pulse 64   Temp 97.7 F (36.5 C)   Resp 18   Ht 6\' 2"  (1.88 m)   Wt 120.2 kg   SpO2 99%   BMI 34.02 kg/m   Plan: Admit for NSG consultation, further work-up given progressive lower extremity weakness.   6:43 PM Spoke with Dr. Cyndia Bent, [facility] who accepts for admission.                                  Medical Decision Making Amount and/or Complexity of Data Reviewed Labs: ordered.   Admit for further work-up and management of spinal disease with progressive weakness.           Final Clinical Impression(s) / ED Diagnoses Final diagnoses:  Spinal cord compression [facility])    Rx / DC Orders ED Discharge Orders     None         Renne Crigler, PA-C 10/23/23 1845    Lonell Grandchild, MD 10/24/23 [phone]

## 2023-10-23 NOTE — ED Notes (Addendum)
Attempted to call for a consult to Dr. Pamelia Hoit with Oncology, left voicemail.

## 2023-10-23 NOTE — ED Notes (Signed)
Neuro consult called at 1655. Spoke with Aram Beecham. Dr. Maisie Fus on call and will contact J. Geiple PA-C.

## 2023-10-23 NOTE — ED Notes (Signed)
Report was given to [facility], [place]

## 2023-10-23 NOTE — ED Notes (Signed)
Consult called to Dr. Pamelia Hoit at [phone], transferred to [place], [place].

## 2023-10-23 NOTE — ED Notes (Signed)
Attempted to call for a consult to Dr. Katrinka Blazing with Oncology, left voicemail.

## 2023-10-23 NOTE — Progress Notes (Addendum)
Pt reportedly w/ concern for dx of MM w/ increasing weakness over the last few weeks. MRI T/L spine showing diffuse lesions and extraosseous mass T4-6 w/ potential for cord compression. Recommend transfer to [facility] for further eval/mgmt. NSGY to follow.

## 2023-10-23 NOTE — Progress Notes (Addendum)
Plan of Care Note for accepted transfer   Patient: Charles Barrera MRN: [id number]   DOA: 10/23/2023  Facility requesting transfer: [facility] ED Requesting Provider: Rhea Bleacher, PA-C Reason for transfer:widespread metastatic disease to T and L spine; possible cord compression  Facility course: 42 yo M with no significant past medical hx presented with weakness to lower extremity since September and back pain since February. Went to ED because he felt like he could barely walk. Had workup with PCP at [facility] medical reportedly with blood work for multiple myeloma and a CT chest that was reportedly negative but documentation cannot be seen in Epic system.   On exam, he ambulated with a cane but had difficulty lifting lower extremity into the bed. PA was not able to elicit patella reflexes but denies any saddle anesthesia, no bowel or bladder incontinence.   MRI demonstrated   T-spine 1. 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 Virtually every level is involved. No evidence of regional pathologic fracture. 2. Extraosseous mass in the posterior right side of the spinal canal extending from mid T4 to mid T6. This displaces the spinal cord and could be compressing the cord.  L-spine 1. Metastatic disease affecting all the vertebral bodies throughout the region as well as the sacrum and iliac bones. No evidence of regional pathologic fracture. No extraosseous tumor in this region. 2. Mild non-compressive disc bulges at L4-5 and L5-S1.  PA Consulted neurosurgery Charles Riley, PA-C and they recommend [facility]. No definite intervention was suggested.   Oncology Dr. Pamelia Hoit also consulted and will see. Did not suggest any immediate intervention.    Plan of care: The patient is accepted for admission to Med-surg  unit, at [facility]..  ED PA to continue care of patient when he remains in ED.   Author: Anselm Jungling, DO 10/23/2023  Check  [email or web address] for on-call coverage.  Nursing staff, Please call [facility] Admits & Consults System-Wide number on Amion as soon as patient's arrival, so appropriate admitting provider can evaluate the pt.

## 2023-10-24 DIAGNOSIS — G9520 Unspecified cord compression: Secondary | ICD-10-CM | POA: Diagnosis not present

## 2023-10-24 DIAGNOSIS — C7951 Secondary malignant neoplasm of bone: Secondary | ICD-10-CM | POA: Diagnosis not present

## 2023-10-24 LAB — BASIC METABOLIC PANEL
Anion gap: 10 (ref 5–15)
BUN: 9 mg/dL (ref 6–20)
CO2: 25 mmol/L (ref 22–32)
Calcium: 9.1 mg/dL (ref 8.9–10.3)
Chloride: 105 mmol/L (ref 98–111)
Creatinine, Ser: 1.05 mg/dL (ref 0.61–1.24)
GFR, Estimated: >60 mL/min (ref >60)
Glucose, Bld: 108 mg/dL — ABNORMAL HIGH (ref 70–99)
Potassium: 3.7 mmol/L (ref 3.5–5.1)
Sodium: 140 mmol/L (ref 135–145)

## 2023-10-24 LAB — CBC
HCT: 44.1 % (ref 39.0–52.0)
Hemoglobin: 14.2 g/dL (ref 13.0–17.0)
MCH: 28 pg (ref 26.0–34.0)
MCHC: 32.2 g/dL (ref 30.0–36.0)
MCV: 87 fL (ref 80.0–100.0)
Platelets: 202 10*3/uL (ref 150–400)
RBC: 5.07 MIL/uL (ref 4.22–5.81)
RDW: 13 % (ref 11.5–15.5)
WBC: 5.3 10*3/uL (ref 4.0–10.5)
nRBC: 0 % (ref 0.0–0.2)

## 2023-10-24 LAB — TYPE AND SCREEN
ABO/RH(D): O POS
Antibody Screen: NEGATIVE

## 2023-10-24 LAB — HIV ANTIBODY (ROUTINE TESTING W REFLEX): HIV Screen 4th Generation wRfx: NONREACTIVE

## 2023-10-24 LAB — ABO/RH: ABO/RH(D): O POS

## 2023-10-24 LAB — LACTATE DEHYDROGENASE: LDH: 171 U/L (ref 98–192)

## 2023-10-24 MED ORDER — CEFAZOLIN IN SODIUM CHLORIDE 3-0.9 GM/100ML-% IV SOLN
3.0000 g | INTRAVENOUS | Status: AC
  Administered 2023-10-25: 3 g via INTRAVENOUS
  Filled 2023-10-24: qty 100

## 2023-10-24 MED ORDER — DEXAMETHASONE 4 MG PO TABS
6.0000 mg | ORAL_TABLET | Freq: Four times a day (QID) | ORAL | Status: DC
  Administered 2023-10-24 – 2023-10-28 (×16): 6 mg via ORAL
  Filled 2023-10-24 (×15): qty 1

## 2023-10-24 NOTE — Plan of Care (Signed)

## 2023-10-24 NOTE — Progress Notes (Signed)
PT Cancellation Note  Patient Details Name: Charles Barrera MRN: [id number] DOB: 11-Jan-1981   Cancelled Treatment:    Reason Eval/Treat Not Completed: Other (comment)  Order acknowledged. MD in room speaking with patient upon my arrival. Will follow up for formal evaluation in the morning.  Kathlyn Sacramento, PT, DPT [facility]  Rehabilitation Services Physical Therapist Office: [phone] Website: [email or web address]   Berton Mount 10/24/2023, 4:02 PM

## 2023-10-24 NOTE — Consult Note (Signed)
[facility]  [phone]   HEMATOLOGY/ONCOLOGY IN-PATIENT CONSULTATION NOTE   PATIENT NAME: Charles Barrera   MR#: [id number] DOB: [birth year] CSN#: [id number]   DATE OF SERVICE: 10/24/2023  Requesting Physician: [facility]   Patient Care Team: Caffie Damme, MD as PCP - General (Family Medicine)  REASON FOR CONSULTATION:  Suspected metastatic malignancy  HISTORY OF PRESENT ILLNESS  Charles Barrera is a 42 y.o. gentleman with no significant past medical history presented to the hospital for progressive weakness and walking difficulty. He began having mid back pain in February, but in September, began having difficulty with leg weakness and walking. He had workup at [facility] and Dr. Precious Gilding ortho spine and numerous bone lesions were seen in his lumbar spine 10/5. By patient's report, there was concern for myeloma but labs were negative or inconclusive. He began having increased walking difficulty which prompted visit to [facility] ER. He has been walking with a cane but feels like the last few weeks he has gotten worse to the point he may need a wheelchair. He has numbness in his legs and in his trunk from the chest down.Concurrently, he has been experiencing a sensation of numbness in his stomach, which he first noticed approximately two to three weeks ago. The patient denies any other symptoms such as blood in stools, changes in appetite or weight, nausea, vomiting, heartburn, acid reflux, scrotal swelling, or abdominal pain. He has no known family history of cancer and does not smoke. The patient has sought medical attention for these symptoms since September, undergoing an MRI and CT scan, but the cause of his symptoms remains undetermined.  No family hx of blood cancers/dyscrasias, cancers. He does not smoke.   In the ED, MRI of the thoracic and lumbar spine is concerning for metastatic disease involving nearly all vertebral bodies, sacrum, and iliac  bones with extraosseous mass extending from T4-T6 and displacing the spinal cord.   With these findings, neurosurgery and our service was consulted for further recommendations.  MEDICAL HISTORY History reviewed. No pertinent past medical history.   SURGICAL HISTORY History reviewed. No pertinent surgical history.   ALLERGIES  No Known Allergies  FAMILY HISTORY  History reviewed. No pertinent family history.   SOCIAL HISTORY   Social History   Socioeconomic History   Marital status: Married    Spouse name: Not on file   Number of children: Not on file   Years of education: Not on file   Highest education level: Not on file  Occupational History   Not on file  Tobacco Use   Smoking status: Never   Smokeless tobacco: Never  Substance and Sexual Activity   Alcohol use: Yes   Drug use: Not Currently   Sexual activity: Not on file  Other Topics Concern   Not on file  Social History Narrative   Not on file   Social Determinants of Health   Financial Resource Strain: Not on file  Food Insecurity: No Food Insecurity (10/23/2023)   Hunger Vital Sign    Worried About Running Out of Food in the Last Year: Never true    Ran Out of Food in the Last Year: Never true  Transportation Needs: No Transportation Needs (10/23/2023)   PRAPARE - Administrator, Civil Service (Medical): No    Lack of Transportation (Non-Medical): No  Physical Activity: Not on file  Stress: Not on file  Social Connections: Unknown (05/08/2022)   Received [facility]  Social Network    Social Network: Not on file  Intimate Partner Violence: Not At Risk (10/23/2023)   Humiliation, Afraid, Rape, and Kick questionnaire    Fear of Current or Ex-Partner: No    Emotionally Abused: No    Physically Abused: No    Sexually Abused: No    CURRENT MEDICATIONS   Current Outpatient Medications  Medication Instructions   pregabalin (LYRICA) 150 mg, Oral, 2 times daily      REVIEW OF SYSTEMS   Review of Systems - Oncology  All other pertinent review of systems is negative except as mentioned above in HPI  PHYSICAL EXAMINATION  ECOG PERFORMANCE STATUS: 2 - Symptomatic, <50% confined to bed  Vitals:   10/24/23 1205 10/24/23 1237  BP: 112/79 121/80  Pulse: 69 71  Resp: 18 18  Temp: 97.9 F (36.6 C) 98.1 F (36.7 C)  SpO2: 98% 100%   Filed Weights   10/23/23 1409  Weight: 265 lb (120.2 kg)    Physical Exam Constitutional:      General: He is not in acute distress.    Appearance: Normal appearance.  HENT:     Head: Normocephalic and atraumatic.  Eyes:     General: No scleral icterus.    Extraocular Movements: Extraocular movements intact.     Conjunctiva/sclera: Conjunctivae normal.  Cardiovascular:     Rate and Rhythm: Normal rate and regular rhythm.  Pulmonary:     Effort: Pulmonary effort is normal.  Abdominal:     General: There is no distension.  Musculoskeletal:     Right lower leg: No edema.     Left lower leg: No edema.  Neurological:     Mental Status: He is alert and oriented to person, place, and time.     Comments: Diminished strength in lower extremities  Psychiatric:        Mood and Affect: Mood normal.        Behavior: Behavior normal.        Thought Content: Thought content normal.        Judgment: Judgment normal.     LABORATORY DATA:   I have reviewed the data as listed  Results for orders placed or performed during the hospital encounter of 10/23/23 (from the past 24 hour(s))  CBC with Differential   Collection Time: 10/23/23  2:34 PM  Result Value Ref Range   WBC 5.3 4.0 - 10.5 K/uL   RBC 4.91 4.22 - 5.81 MIL/uL   Hemoglobin 14.0 13.0 - 17.0 g/dL   HCT 16.1 09.6 - 04.5 %   MCV 87.2 80.0 - 100.0 fL   MCH 28.5 26.0 - 34.0 pg   MCHC 32.7 30.0 - 36.0 g/dL   RDW 40.9 81.1 - 91.4 %   Platelets 203 150 - 400 K/uL   nRBC 0.0 0.0 - 0.2 %   Neutrophils Relative % 55 %   Neutro Abs 2.9 1.7 - 7.7 K/uL    Lymphocytes Relative 37 %   Lymphs Abs 1.9 0.7 - 4.0 K/uL   Monocytes Relative 7 %   Monocytes Absolute 0.4 0.1 - 1.0 K/uL   Eosinophils Relative 1 %   Eosinophils Absolute 0.1 0.0 - 0.5 K/uL   Basophils Relative 0 %   Basophils Absolute 0.0 0.0 - 0.1 K/uL   Immature Granulocytes 0 %   Abs Immature Granulocytes 0.01 0.00 - 0.07 K/uL  Basic metabolic panel   Collection Time: 10/23/23  2:34 PM  Result Value Ref Range   Sodium  139 135 - 145 mmol/L   Potassium 3.5 3.5 - 5.1 mmol/L   Chloride 101 98 - 111 mmol/L   CO2 29 22 - 32 mmol/L   Glucose, Bld 91 70 - 99 mg/dL   BUN 10 6 - 20 mg/dL   Creatinine, Ser 2.84 0.61 - 1.24 mg/dL   Calcium 9.1 8.9 - 13.2 mg/dL   GFR, Estimated >44 >01 mL/min   Anion gap 9 5 - 15  HIV Antibody (routine testing w rflx)   Collection Time: 10/24/23  7:28 AM  Result Value Ref Range   HIV Screen 4th Generation wRfx Non Reactive Non Reactive  Basic metabolic panel   Collection Time: 10/24/23  7:28 AM  Result Value Ref Range   Sodium 140 135 - 145 mmol/L   Potassium 3.7 3.5 - 5.1 mmol/L   Chloride 105 98 - 111 mmol/L   CO2 25 22 - 32 mmol/L   Glucose, Bld 108 (H) 70 - 99 mg/dL   BUN 9 6 - 20 mg/dL   Creatinine, Ser 0.27 0.61 - 1.24 mg/dL   Calcium 9.1 8.9 - 25.3 mg/dL   GFR, Estimated >66 >44 mL/min   Anion gap 10 5 - 15  CBC   Collection Time: 10/24/23  7:28 AM  Result Value Ref Range   WBC 5.3 4.0 - 10.5 K/uL   RBC 5.07 4.22 - 5.81 MIL/uL   Hemoglobin 14.2 13.0 - 17.0 g/dL   HCT 03.4 74.2 - 59.5 %   MCV 87.0 80.0 - 100.0 fL   MCH 28.0 26.0 - 34.0 pg   MCHC 32.2 30.0 - 36.0 g/dL   RDW 63.8 75.6 - 43.3 %   Platelets 202 150 - 400 K/uL   nRBC 0.0 0.0 - 0.2 %      RADIOGRAPHIC STUDIES:  I have personally reviewed the radiological images as listed and agree with the findings in the report.  MR Lumbar Spine W Wo Contrast  Result Date: 10/23/2023 CLINICAL DATA:  Myelopathy.  Weakness of the lower extremities. EXAM: MRI LUMBAR SPINE WITHOUT  AND WITH CONTRAST TECHNIQUE: Multiplanar and multiecho pulse sequences of the lumbar spine were obtained without and with intravenous contrast. CONTRAST:  10mL GADAVIST GADOBUTROL 1 MMOL/ML IV SOLN COMPARISON:  None Available. FINDINGS: Segmentation:  5 lumbar type vertebral bodies. Alignment:  Normal Vertebrae: Metastatic disease affecting all the vertebral bodies throughout the region as well as the sacrum in the iliac bones. No evidence of regional pathologic fracture. No extraosseous tumor in this region. Conus medullaris and cauda equina: Conus extends to the T12 level. Conus and cauda equina appear normal. Paraspinal and other soft tissues: No abnormality seen. Disc levels: Mild non-compressive disc bulges at L4-5 and L5-S1. IMPRESSION: 1. Metastatic disease affecting all the vertebral bodies throughout the region as well as the sacrum and iliac bones. No evidence of regional pathologic fracture. No extraosseous tumor in this region. 2. Mild non-compressive disc bulges at L4-5 and L5-S1. 3. Critical Value/emergent results were called by telephone at the time of interpretation on 10/23/2023 at 4:51 pm to provider [facility] , who verbally acknowledged these results. Electronically Signed   By: Paulina Fusi M.D.   On: 10/23/2023 16:53   MR THORACIC SPINE W WO CONTRAST  Result Date: 10/23/2023 CLINICAL DATA:  Myelopathy, acute. Bilateral lower extremity weakness. Balance disturbance. Symptoms began over the last few months. EXAM: MRI THORACIC WITHOUT AND WITH CONTRAST TECHNIQUE: Multiplanar and multiecho pulse sequences of the thoracic spine were obtained without and  with intravenous contrast. CONTRAST:  10mL GADAVIST GADOBUTROL 1 MMOL/ML IV SOLN COMPARISON:  None Available. FINDINGS: Alignment:  No malalignment. Vertebrae: There is widespread metastatic disease or myeloma throughout the spine. Virtually every level is involved. There is no evidence of regional pathologic fracture. There is an intraspinal  mass which appears to be extradural extending from mid T4 to mid T6 in the posterior right side of the spinal canal. This displaces the spinal cord and could be compressing the cord. No other extraosseous tumor seen in the region. Paraspinal and other soft tissues: No para spinous lesion identified. Disc levels: No evidence of disc level pathology. No degenerative disease or herniation. IMPRESSION: 1. 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 Virtually every level is involved. No evidence of regional pathologic fracture. 2. Extraosseous mass in the posterior right side of the spinal canal extending from mid T4 to mid T6. This displaces the spinal cord and could be compressing the cord. 3. Call report in progress. Electronically Signed   By: Paulina Fusi M.D.   On: 10/23/2023 16:48    ASSESSMENT & PLAN:   42 y.o. gentleman with no significant past medical history presented to the hospital for progressive weakness and walking difficulty. He began having mid back pain in February, but in September, began having difficulty with leg weakness and walking.  MRI concerning for metastatic disease to the spine of uncertain primary.  There is evidence of diffuse marrow involvement on MRI, which is usually indicative of a process like multiple myeloma.  However he has no anemia, kidney dysfunction, or elevated calcium levels.   Though myeloma is still likely, we need to pursue workup for other etiologies as well.  Tissue biopsy would be paramount in establishing diagnosis.   I did discuss his case with Dr. Maisie Fus, neurosurgery.  Given high-grade cord compression, decision made to resect the epidural tumor which would serve both diagnostic and therapeutic benefits.  Procedure tentatively scheduled for tomorrow.  Continue current dose of Decadron.  When time permits, recommend proceeding with CT chest, abdomen pelvis to look for any other sources of malignancy.  Myeloma workup is ongoing  including SPEP, IFE, serum free light chains, 24-hour urine protein electrophoresis and immunoelectrophoresis.   I did order additional tumor markers with morning set of labs, although prostate cancer and other diagnoses are less likely in his age group, especially with no family history and no personal history of smoking.  Once biopsy is resulted, we will get our radiation oncology folks also involved.  All of this was discussed in detail with the patient and his wife was on the phone.  Rest of care as per primary team and other specialties.  Thanks for the opportunity to participate in the care of this patient. Please contact me if there are any questions.   Meryl Crutch, MD Medical Oncology and Hematology 10/24/2023 12:48 PM    This document was completed utilizing speech recognition software. Grammatical errors, random word insertions, pronoun errors, and incomplete sentences are an occasional consequence of this system due to software limitations, ambient noise, and hardware issues. Any formal questions or concerns about the content, text or information contained within the body of this dictation should be directly addressed to the provider for clarification.

## 2023-10-24 NOTE — Consult Note (Signed)
CC: walking difficulty  HPI:     Patient is a 42 y.o. male who presented to the hospital for progressive weakness and walking difficulty.  He began having mid back pain in February, but in September, began having difficulty with leg weakness and walking.  He had workup at [facility] and Dr. Precious Gilding ortho spine and numerous bone lesions were seen in his lumbar spine 10/5.  By patient's report, there was concern for myeloma but labs were negative or inconclusive.  He began having increased walking difficulty which prompted visit to [facility] ER.  He has been walking with a cane but feels like the last few weeks he has gotten worse to the point he may need a wheelchair.  He has numbness in his legs and in his trunk from the chest down.  No family hx of blood cancers/dyscrasias, cancers.  He does not smoke.    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 No pertinent past medical history.  History reviewed. No pertinent surgical history.  Medications Prior to Admission  Medication Sig Dispense Refill Last Dose   pregabalin (LYRICA) 150 MG capsule Take 150 mg by mouth 2 (two) times daily.   10/23/2023   No Known Allergies  Social History   Tobacco Use   Smoking status: Never   Smokeless tobacco: Never  Substance Use Topics   Alcohol use: Yes    History reviewed. No pertinent family history.   Review of Systems Pertinent items noted in HPI and remainder of comprehensive ROS otherwise negative.  Objective:   Patient Vitals for the past 8 hrs:  BP Temp Temp src Pulse Resp SpO2  10/24/23 0839 (!) 125/91 97.7 F (36.5 C) Oral 65 17 100 %  10/24/23 0416 116/80 97.8 F (36.6 C) Oral (!) 59 18 98 %   No intake/output data recorded. Total I/O In: 240 [P.O.:240] Out: -     General : Alert, cooperative, no distress, appears stated age   Head:   Normocephalic/atraumatic    Eyes: PERRL, conjunctiva/corneas clear, EOM's intact. Fundi could not be visualized Neck: Supple Chest:  Respirations unlabored Chest wall: no tenderness or deformity Heart: Regular rate and rhythm Abdomen: Soft, nontender and nondistended Extremities: warm and well-perfused Skin: normal turgor, color and texture Neurologic:  Alert, oriented x 3.  Eyes open spontaneously. PERRL, EOMI, VFC, no facial droop. V1-3 intact.  No dysarthria, tongue protrusion symmetric.  CNII-XII intact. Normal strength, sensation and reflexes in UEs. 4/5 HF, KE, PF, 3/5 DF bilaterally. Decreased gross touch below T4.       Data ReviewCBC:  Lab Results  Component Value Date   WBC 5.3 10/24/2023   RBC 5.07 10/24/2023   BMP:  Lab Results  Component Value Date   GLUCOSE 108 (H) 10/24/2023   CO2 25 10/24/2023   BUN 9 10/24/2023   CREATININE 1.05 10/24/2023   CALCIUM 9.1 10/24/2023   Radiology review:  MRI T-spine with/without contrast personally reviewed. Assessment:   Principal Problem:   Spinal cord compression [facility]) Active Problems:   Malignant neoplasm metastatic to bone [facility])  42 yo M with diffuse bony lesions, epidural mass at T4-6 with severe cord compression Plan:   - I had a long discussion with the patient-- his wife was listening over the phone.  With the diffuse marrow involvement, a hematologic malignancy is main suspicion.  However, with his  high grade cord compression, the safest course of action for his spinal cord health is to resect his epidural tumor which would provide both diagnostic and therapeutic benefit.  I did explain that even after decompressing his spinal cord, he will still likely need therapy to rehabilitate his gait difficulty and weakness, and spinal cord recovery may take months.  Additionally, depending on the diagnosis, he may need additional adjuvant treatment such as chemotherapy and/or radiation.  I discussed his case with oncologist Dr.  Arlana Pouch who agreed with this plan of care, and similarly harbored uncertainty of his pathology.  Risks, benefits, alternatives, and expected convalescence were discussed with her.  Risks discussed included, but were not limited to bleeding, pain, infection, scar, spinal fluid leak, neurologic deficit, instability, and death.  He would like to talk with his family more before deciding to proceed, but we will go ahead and plan for surgery tomorrow afternoon.  Continue with decadron.

## 2023-10-24 NOTE — Progress Notes (Signed)
Pt seen and examined.  Back pain since April, myelopathy symptoms since September.  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  Recommend oncology consult, SPEP, UPEP.  Can start dexamethasone 6 mg q6h.  If myeloma, recommended treatment would be XRT.  If diagnosis unclear, we could perform thoracic laminectomies and resection of epidural tumor for both diagnostic and therapeutic purpose.

## 2023-10-24 NOTE — TOC Initial Note (Signed)
Transition of Care [facility]) - Initial/Assessment Note    Patient Details  Name: Charles Barrera MRN: [id number] Date of Birth: 1981/08/24  Transition of Care [facility]) CM/SW Contact:    Kermit Balo, RN Phone Number: 10/24/2023, 10:35 AM  Clinical Narrative:                  Patient is from home with his spouse. They both work.  Pt drives self/ manages his own medications and denies any issues.  Wife states has had balance issues and been using a cane. Cm has asked MD about therapy evals.  TOC following.  Expected Discharge Plan: Home/Self Care Barriers to Discharge: Continued Medical Work up   Patient Goals and CMS Choice            Expected Discharge Plan and Services       Living arrangements for the past 2 months: Single Family Home                                      Prior Living Arrangements/Services Living arrangements for the past 2 months: Single Family Home Lives with:: Spouse Patient language and need for interpreter reviewed:: Yes Do you feel safe going back to the place where you live?: Yes          Current home services: DME (cane) Criminal Activity/Legal Involvement Pertinent to Current Situation/Hospitalization: No - Comment as needed  Activities of Daily Living   ADL Screening (condition at time of admission) Independently performs ADLs?: Yes (appropriate for developmental age) (uses cane) Is the patient deaf or have difficulty hearing?: No Does the patient have difficulty seeing, even when wearing glasses/contacts?: No Does the patient have difficulty concentrating, remembering, or making decisions?: No  Permission Sought/Granted                  Emotional Assessment Appearance:: Appears stated age Attitude/Demeanor/Rapport: Engaged Affect (typically observed): Accepting Orientation: : Oriented to Self, Oriented to Place, Oriented to  Time, Oriented to Situation   Psych Involvement: No (comment)  Admission diagnosis:  Spinal  cord compression [facility]) [G95.20] Patient Active Problem List   Diagnosis Date Noted   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   PCP:  Caffie Damme, MD Pharmacy:   [facility] [phone] - [place], [place] - [address] AT [facility] OF [address] [place] [address] Phone: [phone] Fax: [phone]     Social Determinants of Health (SDOH) Social History: SDOH Screenings   Food Insecurity: No Food Insecurity (10/23/2023)  Housing: Low Risk  (10/23/2023)  Transportation Needs: No Transportation Needs (10/23/2023)  Utilities: Not At Risk (10/23/2023)  Social Connections: Unknown (05/08/2022)   Received from [facility]  Tobacco Use: Low Risk  (10/23/2023)   SDOH Interventions:     Readmission Risk Interventions     No data to display

## 2023-10-24 NOTE — Hospital Course (Signed)
42 y.o. male who denies any significant past medical history and presents for evaluation of back pain and weakness.   Patient noted development of atraumatic mid back pain in February 2024.  This has progressively worsened and he began developing bilateral lower extremity numbness and weakness in September.  He also has some paresthesias involving his abdominal wall.   [facility] ED Course: Upon arrival to the ED, patient is found to be afebrile and saturating well on room air with normal heart rate and stable blood pressure.  BMP and CBC are unremarkable.  MRI of the thoracic and lumbar spine is concerning for metastatic disease involving nearly all vertebral bodies, sacrum, and iliac bones with extraosseous mass extending from T4-T6 and displacing the spinal cord

## 2023-10-24 NOTE — Progress Notes (Signed)
Progress Note   Patient: Charles Barrera ZOX:[id number] DOB: Nov 03, 1981 DOA: 10/23/2023     1 DOS: the patient was seen and examined on 10/24/2023   Brief hospital course: 42 y.o. male who denies any significant past medical history and presents for evaluation of back pain and weakness.   Patient noted development of atraumatic mid back pain in February 2024.  This has progressively worsened and he began developing bilateral lower extremity numbness and weakness in September.  He also has some paresthesias involving his abdominal wall.   [facility] [place] ED Course: Upon arrival to the ED, patient is found to be afebrile and saturating well on room air with normal heart rate and stable blood pressure.  BMP and CBC are unremarkable.  MRI of the thoracic and lumbar spine is concerning for metastatic disease involving nearly all vertebral bodies, sacrum, and iliac bones with extraosseous mass extending from T4-T6 and displacing the spinal cord  Assessment and Plan: 1. Metastatic disease; spinal cord compression - Presents with progressive back pain and b/l LE weakness and has MRI findings concerning for metastatic disease leading to spinal cord compression  - Appreciate input by Oncology and Neurosurgery - Recs for decadron 6mg  q6h was recommended. Will continue -CT chest/abd/pelvis has been ordered, pending -Per Neurosurgery, plan for resection of epidural tumor which would provide diagnostic as well as therapeutic benefit given the high grade compression on the cord -PT/OT had been consulted   Subjective: Still feeling weak in the BLE  Physical Exam: Vitals:   10/24/23 0839 10/24/23 1205 10/24/23 1237 10/24/23 1523  BP: (!) 125/91 112/79 121/80 119/80  Pulse: 65 69 71 69  Resp: 17 18 18 18   Temp: 97.7 F (36.5 C) 97.9 F (36.6 C) 98.1 F (36.7 C) 98.4 F (36.9 C)  TempSrc: Oral Oral Oral Oral  SpO2: 100% 98% 100% 99%  Weight:      Height:       General exam: Awake, laying in bed, in  nad Respiratory system: Normal respiratory effort, no wheezing Cardiovascular system: regular rate, s1, s2 Gastrointestinal system: Soft, nondistended, positive BS Central nervous system: CN2-12 grossly intact, no tremors Extremities: Perfused, no clubbing Skin: Normal skin turgor, no notable skin lesions seen Psychiatry: Mood normal // no visual hallucinations   Data Reviewed:  Labs reviewed: Na 140, K 3.7, Cr 1.05, WBC 5.3, Hgb 14.2, Plts 202  Family Communication: Pt in room, pt's wife at bedside  Disposition: Status is: Inpatient Remains inpatient appropriate because: severity of illness  Planned Discharge Destination:  Unclear at this time     Author: Rickey Barbara, MD 10/24/2023 5:46 PM  For on call review [email or web address].

## 2023-10-25 ENCOUNTER — Inpatient Hospital Stay (HOSPITAL_COMMUNITY): Payer: BC Managed Care – PPO

## 2023-10-25 ENCOUNTER — Inpatient Hospital Stay (HOSPITAL_COMMUNITY)
Admission: EM | Disposition: A | Discharge status: Home / Self Care | Payer: Self-pay | Source: Home / Self Care | Attending: Internal Medicine

## 2023-10-25 ENCOUNTER — Encounter (HOSPITAL_COMMUNITY): Payer: Self-pay | Admitting: Family Medicine

## 2023-10-25 ENCOUNTER — Other Ambulatory Visit: Payer: Self-pay

## 2023-10-25 ENCOUNTER — Inpatient Hospital Stay (HOSPITAL_COMMUNITY): Payer: BC Managed Care – PPO | Admitting: Anesthesiology

## 2023-10-25 DIAGNOSIS — G9520 Unspecified cord compression: Secondary | ICD-10-CM | POA: Diagnosis not present

## 2023-10-25 HISTORY — PX: LAMINECTOMY: SHX219

## 2023-10-25 LAB — CBC
HCT: 45.1 % (ref 39.0–52.0)
Hemoglobin: 14.9 g/dL (ref 13.0–17.0)
MCH: 28.4 pg (ref 26.0–34.0)
MCHC: 33 g/dL (ref 30.0–36.0)
MCV: 86.1 fL (ref 80.0–100.0)
Platelets: 235 10*3/uL (ref 150–400)
RBC: 5.24 MIL/uL (ref 4.22–5.81)
RDW: 12.8 % (ref 11.5–15.5)
WBC: 10.6 10*3/uL — ABNORMAL HIGH (ref 4.0–10.5)
nRBC: 0 % (ref 0.0–0.2)

## 2023-10-25 LAB — BASIC METABOLIC PANEL
Anion gap: 8 (ref 5–15)
BUN: 12 mg/dL (ref 6–20)
CO2: 24 mmol/L (ref 22–32)
Calcium: 9.3 mg/dL (ref 8.9–10.3)
Chloride: 107 mmol/L (ref 98–111)
Creatinine, Ser: 0.95 mg/dL (ref 0.61–1.24)
GFR, Estimated: >60 mL/min (ref >60)
Glucose, Bld: 180 mg/dL — ABNORMAL HIGH (ref 70–99)
Potassium: 3.8 mmol/L (ref 3.5–5.1)
Sodium: 139 mmol/L (ref 135–145)

## 2023-10-25 LAB — PSA: Prostatic Specific Antigen: 0.45 ng/mL (ref 0.00–4.00)

## 2023-10-25 LAB — SURGICAL PCR SCREEN
MRSA, PCR: NEGATIVE
Staphylococcus aureus: NEGATIVE

## 2023-10-25 SURGERY — CERVICAL LAMINECTOMY FOR TUMOR
Anesthesia: General | Site: Spine Thoracic

## 2023-10-25 MED ORDER — BUPIVACAINE HCL (PF) 0.5 % IJ SOLN
INTRAMUSCULAR | Status: AC
  Filled 2023-10-25: qty 30

## 2023-10-25 MED ORDER — HYDROMORPHONE HCL 1 MG/ML IJ SOLN
INTRAMUSCULAR | Status: AC
  Filled 2023-10-25: qty 0.5

## 2023-10-25 MED ORDER — LIDOCAINE-EPINEPHRINE 1 %-1:100000 IJ SOLN
INTRAMUSCULAR | Status: AC
  Filled 2023-10-25: qty 1

## 2023-10-25 MED ORDER — HYDROMORPHONE HCL 1 MG/ML IJ SOLN
INTRAMUSCULAR | Status: AC
  Filled 2023-10-25: qty 1

## 2023-10-25 MED ORDER — PHENOL 1.4 % MT LIQD: 1.0000 | OROMUCOSAL | Status: DC | PRN

## 2023-10-25 MED ORDER — DEXAMETHASONE SODIUM PHOSPHATE 10 MG/ML IJ SOLN
INTRAMUSCULAR | Status: AC
  Filled 2023-10-25: qty 2

## 2023-10-25 MED ORDER — 0.9 % SODIUM CHLORIDE (POUR BTL) OPTIME
TOPICAL | Status: DC | PRN
  Administered 2023-10-25: 1000 mL

## 2023-10-25 MED ORDER — ROCURONIUM BROMIDE 10 MG/ML (PF) SYRINGE
PREFILLED_SYRINGE | INTRAVENOUS | Status: AC
  Filled 2023-10-25: qty 40

## 2023-10-25 MED ORDER — HYDROMORPHONE HCL 1 MG/ML IJ SOLN
0.2500 mg | INTRAMUSCULAR | Status: DC | PRN
  Administered 2023-10-25 (×4): 0.5 mg via INTRAVENOUS

## 2023-10-25 MED ORDER — THROMBIN 20000 UNITS EX SOLR
CUTANEOUS | Status: AC
  Filled 2023-10-25: qty 20000

## 2023-10-25 MED ORDER — THROMBIN 5000 UNITS EX SOLR
OROMUCOSAL | Status: DC | PRN
  Administered 2023-10-25 (×2): 5 mL via TOPICAL

## 2023-10-25 MED ORDER — THROMBIN 5000 UNITS EX SOLR
CUTANEOUS | Status: AC
  Filled 2023-10-25: qty 5000

## 2023-10-25 MED ORDER — SODIUM CHLORIDE 0.9% FLUSH
3.0000 mL | INTRAVENOUS | Status: DC | PRN
Start: 2023-10-25 — End: 2023-10-28

## 2023-10-25 MED ORDER — PHENYLEPHRINE 80 MCG/ML (10ML) SYRINGE FOR IV PUSH (FOR BLOOD PRESSURE SUPPORT)
PREFILLED_SYRINGE | INTRAVENOUS | Status: AC
  Filled 2023-10-25: qty 20

## 2023-10-25 MED ORDER — LIDOCAINE 2% (20 MG/ML) 5 ML SYRINGE
INTRAMUSCULAR | Status: DC | PRN
  Administered 2023-10-25: 100 mg via INTRAVENOUS

## 2023-10-25 MED ORDER — CHLORHEXIDINE GLUCONATE 0.12 % MT SOLN
OROMUCOSAL | Status: AC
  Administered 2023-10-25: 15 mL via OROMUCOSAL
  Filled 2023-10-25: qty 15

## 2023-10-25 MED ORDER — LACTATED RINGERS IV SOLN: INTRAVENOUS | Status: DC | PRN

## 2023-10-25 MED ORDER — MENTHOL 3 MG MT LOZG: 1.0000 | LOZENGE | OROMUCOSAL | Status: DC | PRN

## 2023-10-25 MED ORDER — MEPERIDINE HCL 25 MG/ML IJ SOLN: 6.2500 mg | INTRAMUSCULAR | Status: DC | PRN

## 2023-10-25 MED ORDER — ORAL CARE MOUTH RINSE: 15.0000 mL | Freq: Once | OROMUCOSAL | Status: AC

## 2023-10-25 MED ORDER — LIDOCAINE-EPINEPHRINE 1 %-1:100000 IJ SOLN
INTRAMUSCULAR | Status: DC | PRN
  Administered 2023-10-25: 4 mL

## 2023-10-25 MED ORDER — EPHEDRINE 5 MG/ML INJ
INTRAVENOUS | Status: AC
  Filled 2023-10-25: qty 5

## 2023-10-25 MED ORDER — MIDAZOLAM HCL 2 MG/2ML IJ SOLN
INTRAMUSCULAR | Status: AC
  Filled 2023-10-25: qty 2

## 2023-10-25 MED ORDER — ROCURONIUM BROMIDE 10 MG/ML (PF) SYRINGE
PREFILLED_SYRINGE | INTRAVENOUS | Status: DC | PRN
  Administered 2023-10-25: 80 mg via INTRAVENOUS
  Administered 2023-10-25 (×2): 20 mg via INTRAVENOUS

## 2023-10-25 MED ORDER — CHLORHEXIDINE GLUCONATE 0.12 % MT SOLN: 15.0000 mL | Freq: Once | OROMUCOSAL | Status: AC

## 2023-10-25 MED ORDER — HYDROMORPHONE HCL 1 MG/ML IJ SOLN
INTRAMUSCULAR | Status: DC | PRN
  Administered 2023-10-25: .5 mg via INTRAVENOUS

## 2023-10-25 MED ORDER — SUGAMMADEX SODIUM 200 MG/2ML IV SOLN
INTRAVENOUS | Status: DC | PRN
  Administered 2023-10-25: 400 mg via INTRAVENOUS

## 2023-10-25 MED ORDER — ACETAMINOPHEN 10 MG/ML IV SOLN
INTRAVENOUS | Status: DC | PRN
  Administered 2023-10-25: 1000 mg via INTRAVENOUS

## 2023-10-25 MED ORDER — OXYCODONE HCL 5 MG/5ML PO SOLN: 5.0000 mg | Freq: Once | ORAL | Status: DC | PRN

## 2023-10-25 MED ORDER — PREGABALIN 75 MG PO CAPS
150.0000 mg | ORAL_CAPSULE | Freq: Two times a day (BID) | ORAL | Status: DC
  Administered 2023-10-25 – 2023-10-28 (×6): 150 mg via ORAL
  Filled 2023-10-25 (×6): qty 2

## 2023-10-25 MED ORDER — IOHEXOL 350 MG/ML SOLN
75.0000 mL | Freq: Once | INTRAVENOUS | Status: AC | PRN
  Administered 2023-10-25: 75 mL via INTRAVENOUS

## 2023-10-25 MED ORDER — ALBUMIN HUMAN 5 % IV SOLN: INTRAVENOUS | Status: DC | PRN

## 2023-10-25 MED ORDER — CEFAZOLIN SODIUM-DEXTROSE 2-4 GM/100ML-% IV SOLN
2.0000 g | Freq: Three times a day (TID) | INTRAVENOUS | Status: AC
  Administered 2023-10-26 (×2): 2 g via INTRAVENOUS
  Filled 2023-10-25 (×2): qty 100

## 2023-10-25 MED ORDER — SODIUM CHLORIDE 0.9% FLUSH
3.0000 mL | Freq: Two times a day (BID) | INTRAVENOUS | Status: DC
  Administered 2023-10-26 – 2023-10-28 (×6): 3 mL via INTRAVENOUS

## 2023-10-25 MED ORDER — FENTANYL CITRATE (PF) 250 MCG/5ML IJ SOLN
INTRAMUSCULAR | Status: AC
  Filled 2023-10-25: qty 5

## 2023-10-25 MED ORDER — MIDAZOLAM HCL 2 MG/2ML IJ SOLN
INTRAMUSCULAR | Status: DC | PRN
  Administered 2023-10-25: 2 mg via INTRAVENOUS

## 2023-10-25 MED ORDER — ONDANSETRON HCL 4 MG/2ML IJ SOLN
INTRAMUSCULAR | Status: DC | PRN
  Administered 2023-10-25: 4 mg via INTRAVENOUS

## 2023-10-25 MED ORDER — OXYCODONE HCL 5 MG PO TABS
10.0000 mg | ORAL_TABLET | ORAL | Status: DC | PRN
  Administered 2023-10-25 – 2023-10-27 (×4): 10 mg via ORAL
  Filled 2023-10-25 (×4): qty 2

## 2023-10-25 MED ORDER — LIDOCAINE 2% (20 MG/ML) 5 ML SYRINGE
INTRAMUSCULAR | Status: AC
  Filled 2023-10-25: qty 10

## 2023-10-25 MED ORDER — MIDAZOLAM HCL 2 MG/2ML IJ SOLN
0.5000 mg | Freq: Once | INTRAMUSCULAR | Status: DC | PRN
Start: 2023-10-25 — End: 2023-10-25

## 2023-10-25 MED ORDER — THROMBIN 20000 UNITS EX SOLR
CUTANEOUS | Status: DC | PRN
  Administered 2023-10-25: 20 mL via TOPICAL

## 2023-10-25 MED ORDER — DEXAMETHASONE SODIUM PHOSPHATE 10 MG/ML IJ SOLN
INTRAMUSCULAR | Status: DC | PRN
  Administered 2023-10-25: 10 mg via INTRAVENOUS

## 2023-10-25 MED ORDER — ACETAMINOPHEN 10 MG/ML IV SOLN
INTRAVENOUS | Status: AC
  Filled 2023-10-25: qty 100

## 2023-10-25 MED ORDER — METOCLOPRAMIDE HCL 5 MG/ML IJ SOLN
INTRAMUSCULAR | Status: AC
  Filled 2023-10-25: qty 4

## 2023-10-25 MED ORDER — ONDANSETRON HCL 4 MG/2ML IJ SOLN
INTRAMUSCULAR | Status: AC
  Filled 2023-10-25: qty 4

## 2023-10-25 MED ORDER — PROPOFOL 10 MG/ML IV BOLUS
INTRAVENOUS | Status: DC | PRN
  Administered 2023-10-25: 200 mg via INTRAVENOUS

## 2023-10-25 MED ORDER — BUPIVACAINE HCL 0.5 % IJ SOLN
INTRAMUSCULAR | Status: DC | PRN
  Administered 2023-10-25: 4 mL

## 2023-10-25 MED ORDER — SODIUM CHLORIDE 0.9 % IV SOLN: 250.0000 mL | INTRAVENOUS | Status: AC

## 2023-10-25 MED ORDER — OXYCODONE HCL 5 MG PO TABS: 5.0000 mg | ORAL_TABLET | Freq: Once | ORAL | Status: DC | PRN

## 2023-10-25 MED ORDER — PROPOFOL 10 MG/ML IV BOLUS
INTRAVENOUS | Status: AC
  Filled 2023-10-25: qty 20

## 2023-10-25 MED ORDER — FENTANYL CITRATE (PF) 250 MCG/5ML IJ SOLN
INTRAMUSCULAR | Status: DC | PRN
  Administered 2023-10-25 (×2): 50 ug via INTRAVENOUS
  Administered 2023-10-25: 150 ug via INTRAVENOUS

## 2023-10-25 SURGICAL SUPPLY — 59 items
ADH SKN CLS APL DERMABOND .7 (GAUZE/BANDAGES/DRESSINGS) ×1
BAG COUNTER SPONGE SURGICOUNT (BAG) ×1 IMPLANT
BAG SPNG CNTER NS LX DISP (BAG) ×1
BLADE SURG 11 STRL SS (BLADE) ×1 IMPLANT
BLADE ULTRA TIP 2M (BLADE) IMPLANT
BUR ACRON 5.0MM COATED (BURR) IMPLANT
BUR MATCHSTICK NEURO 3.0 LAGG (BURR) IMPLANT
CANISTER SUCT 3000ML PPV (MISCELLANEOUS) ×1 IMPLANT
CLIP TI MEDIUM 6 (CLIP) IMPLANT
CNTNR URN SCR LID CUP LEK RST (MISCELLANEOUS) IMPLANT
CONT SPEC 4OZ STRL OR WHT (MISCELLANEOUS) ×1
COVER MAYO STAND STRL (DRAPES) IMPLANT
DERMABOND ADVANCED .7 DNX12 (GAUZE/BANDAGES/DRESSINGS) ×1 IMPLANT
DRAPE LAPAROTOMY 100X72 PEDS (DRAPES) IMPLANT
DRAPE MICROSCOPE SLANT 54X150 (MISCELLANEOUS) ×1 IMPLANT
DRSG OPSITE POSTOP 4X6 (GAUZE/BANDAGES/DRESSINGS) IMPLANT
ELECT REM PT RETURN 9FT ADLT (ELECTROSURGICAL) ×1
ELECTRODE REM PT RTRN 9FT ADLT (ELECTROSURGICAL) ×1 IMPLANT
FORCEPS BIPOLAR SPETZLER 8 1.0 (NEUROSURGERY SUPPLIES) IMPLANT
GAUZE 4X4 16PLY ~~LOC~~+RFID DBL (SPONGE) IMPLANT
GAUZE SPONGE 4X4 12PLY STRL (GAUZE/BANDAGES/DRESSINGS) IMPLANT
GLOVE BIO SURGEON STRL SZ7 (GLOVE) ×2 IMPLANT
GLOVE BIOGEL PI IND STRL 7.5 (GLOVE) ×2 IMPLANT
GLOVE ECLIPSE 7.0 STRL STRAW (GLOVE) ×1 IMPLANT
GLOVE EXAM NITRILE XL STR (GLOVE) IMPLANT
GOWN STRL REUS W/ TWL LRG LVL3 (GOWN DISPOSABLE) IMPLANT
GOWN STRL REUS W/ TWL XL LVL3 (GOWN DISPOSABLE) IMPLANT
GOWN STRL REUS W/TWL 2XL LVL3 (GOWN DISPOSABLE) IMPLANT
GOWN STRL REUS W/TWL LRG LVL3 (GOWN DISPOSABLE)
GOWN STRL REUS W/TWL XL LVL3 (GOWN DISPOSABLE)
HEMOSTAT POWDER KIT SURGIFOAM (HEMOSTASIS) ×1 IMPLANT
HEMOSTAT SURGICEL 2X14 (HEMOSTASIS) IMPLANT
KIT BASIN OR (CUSTOM PROCEDURE TRAY) ×1 IMPLANT
KIT TURNOVER KIT B (KITS) ×1 IMPLANT
NDL HYPO 22X1.5 SAFETY MO (MISCELLANEOUS) ×1 IMPLANT
NDL SPNL 18GX3.5 QUINCKE PK (NEEDLE) ×1 IMPLANT
NEEDLE HYPO 22X1.5 SAFETY MO (MISCELLANEOUS) ×1 IMPLANT
NEEDLE SPNL 18GX3.5 QUINCKE PK (NEEDLE) ×1 IMPLANT
NS IRRIG 1000ML POUR BTL (IV SOLUTION) ×1 IMPLANT
PACK LAMINECTOMY NEURO (CUSTOM PROCEDURE TRAY) ×1 IMPLANT
PAD ARMBOARD 7.5X6 YLW CONV (MISCELLANEOUS) ×3 IMPLANT
PATTIES SURGICAL .25X.25 (GAUZE/BANDAGES/DRESSINGS) IMPLANT
PATTIES SURGICAL .5 X3 (DISPOSABLE) ×1 IMPLANT
PATTIES SURGICAL 1/4 X 3 (GAUZE/BANDAGES/DRESSINGS) ×1 IMPLANT
SPECIMEN JAR SMALL (MISCELLANEOUS) IMPLANT
SPONGE NEURO XRAY DETECT 1X3 (DISPOSABLE) IMPLANT
SPONGE SURGIFOAM ABS GEL 100 (HEMOSTASIS) ×1 IMPLANT
SPONGE T-LAP 4X18 ~~LOC~~+RFID (SPONGE) IMPLANT
SUT MNCRL AB 4-0 PS2 18 (SUTURE) ×1 IMPLANT
SUT NURALON 4 0 TR CR/8 (SUTURE) ×1 IMPLANT
SUT PROLENE 6 0 BV (SUTURE) IMPLANT
SUT SILK 2 0 TIES 10X30 (SUTURE) IMPLANT
SUT VIC AB 0 CT1 18XCR BRD8 (SUTURE) ×1 IMPLANT
SUT VIC AB 0 CT1 8-18 (SUTURE) ×1
SUT VIC AB 2-0 CP2 18 (SUTURE) ×1 IMPLANT
TOWEL GREEN STERILE (TOWEL DISPOSABLE) ×1 IMPLANT
TOWEL GREEN STERILE FF (TOWEL DISPOSABLE) ×1 IMPLANT
TRAY FOLEY MTR SLVR 16FR STAT (SET/KITS/TRAYS/PACK) IMPLANT
WATER STERILE IRR 1000ML POUR (IV SOLUTION) ×1 IMPLANT

## 2023-10-25 NOTE — Progress Notes (Signed)
  Progress Note   Patient: Charles Barrera HWE:[id number] DOB: 11-26-1981 DOA: 10/23/2023     2 DOS: the patient was seen and examined on 10/25/2023   Brief hospital course: 42 y.o. male who denies any significant past medical history and presents for evaluation of back pain and weakness.   Patient noted development of atraumatic mid back pain in February 2024.  This has progressively worsened and he began developing bilateral lower extremity numbness and weakness in September.  He also has some paresthesias involving his abdominal wall.   [facility] ED Course: Upon arrival to the ED, patient is found to be afebrile and saturating well on room air with normal heart rate and stable blood pressure.  BMP and CBC are unremarkable.  MRI of the thoracic and lumbar spine is concerning for metastatic disease involving nearly all vertebral bodies, sacrum, and iliac bones with extraosseous mass extending from T4-T6 and displacing the spinal cord  Assessment and Plan: 1. Metastatic disease; spinal cord compression - Presents with progressive back pain and b/l LE weakness and has MRI findings concerning for metastatic disease leading to spinal cord compression  - Appreciate input by Oncology and Neurosurgery - Recs for decadron 6mg  q6h was recommended. Continued -Per Neurosurgery, plan for resection of epidural tumor this afternoon -PT/OT had been consulted -CT chest/abd/pelvis reviewed. Osseus lesions re-demonstrated. Of note is a focal narrowing at the rectosigmoid junction, possibly mass, stricture, or muscular hypertrophy. Pt in OR this afternoon. Will d/w GI in AM   Subjective: Without complaints this AM. Still generally weak in LE  Physical Exam: Vitals:   10/25/23 0338 10/25/23 0854 10/25/23 1324 10/25/23 1339  BP: 125/79 120/83 123/80 128/88  Pulse: 70 90 69 70  Resp: 17 20 18 18   Temp: 98 F (36.7 C) 98.2 F (36.8 C) 98.4 F (36.9 C) 98.1 F (36.7 C)  TempSrc: Oral Oral Oral Oral  SpO2: 98% 98%  99% 99%  Weight:    120.2 kg  Height:    6\' 2"  (1.88 m)   General exam: Conversant, in no acute distress Respiratory system: normal chest rise, clear, no audible wheezing Cardiovascular system: regular rhythm, s1-s2 Gastrointestinal system: Nondistended, nontender, pos BS Central nervous system: No seizures, no tremors Extremities: No cyanosis, no joint deformities Skin: No rashes, no pallor Psychiatry: Affect normal // no auditory hallucinations   Data Reviewed:  Labs reviewed: Na 139, K 3.8, Cr 0.95, WBC 10.6, Hgb 14.9, Plts 235  Family Communication: Pt in room, pt's wife at bedside  Disposition: Status is: Inpatient Remains inpatient appropriate because: severity of illness  Planned Discharge Destination:  Unclear at this time     Author: Rickey Barbara, MD 10/25/2023 5:37 PM  For on call review [email or web address].

## 2023-10-25 NOTE — Transfer of Care (Signed)
Immediate Anesthesia Transfer of Care Note  Patient: Charles Barrera  Procedure(s) Performed: Thoracic four-thoracic six laminectomy for resection of epidural mass (Spine Thoracic)  Patient Location: PACU  Anesthesia Type:General  Level of Consciousness: oriented, drowsy, and patient cooperative  Airway & Oxygen Therapy: Patient Spontanous Breathing and Patient connected to face mask oxygen  Post-op Assessment: Report given to RN and Post -op Vital signs reviewed and stable  Post vital signs: Reviewed and stable  Last Vitals:  Vitals Value Taken Time  BP 133/80 10/25/23 1848  Temp    Pulse 83 10/25/23 1856  Resp 16 10/25/23 1856  SpO2 99 % 10/25/23 1856  Vitals shown include unfiled device data.  Last Pain:  Vitals:   10/25/23 1339  TempSrc: Oral  PainSc: 0-No pain         Complications: No notable events documented.

## 2023-10-25 NOTE — Evaluation (Signed)
Occupational Therapy Evaluation Patient Details Name: Charles Barrera MRN: [id number] DOB: 1981-05-02 Today's Date: 10/25/2023   History of Present Illness 42 y.o. male admitted 10/30 with low back pain and weakness. 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 Plan for spinal surgery 11/1. No significant PMHx:   Clinical Impression   Charles Barrera was evaluated s/p the above admission list. He is indep at baseline, recently he has been using a [facility] and denies falls. 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 Upon evaluation the pt was limited by BLE paraesthesias, coordination and unsteady gait. Overall he needs up to superivsion A for mobility with a RW, CGA needed for mobility without AD. Due to the deficits listed below the pt also needs up to min A for LB ADLs and set up/mod I for UB ADLs. Educated pt on potential spinal precautions he will have to maintain post op. Pt will benefit from continued acute OT services and OT follow up recommendations pending post-op ability - anticipate not needs.         If plan is discharge home, recommend the following: Assistance with cooking/housework;Assist for transportation    Functional Status Assessment  Patient has had a recent decline in their functional status and demonstrates the ability to make significant improvements in function in a reasonable and predictable amount of time.  Equipment Recommendations  Other (comment) (pending post op)       Precautions / Restrictions Precautions Precautions: Fall Restrictions Weight Bearing Restrictions: No      Mobility Bed Mobility               General bed mobility comments: OOB upoon arrival    Transfers Overall transfer level: Needs assistance Equipment used: Rolling walker (2 wheels), None Transfers: Sit to/from Stand Sit to Stand:  Supervision                  Balance Overall balance assessment: Needs assistance Sitting-balance support: No upper extremity supported, Feet supported Sitting balance-Leahy Scale: Good     Standing balance support: No upper extremity supported Standing balance-Leahy Scale: Fair                             ADL either performed or assessed with clinical judgement   ADL Overall ADL's : Needs assistance/impaired Eating/Feeding: NPO (for sx only)   Grooming: Supervision/safety;Standing   Upper Body Bathing: Set up;Sitting   Lower Body Bathing: Contact guard assist   Upper Body Dressing : Set up;Sitting   Lower Body Dressing: Minimal assistance;Sit to/from stand   Toilet Transfer: Supervision/safety;Rolling walker (2 wheels)   Toileting- Clothing Manipulation and Hygiene: Modified independent;Sitting/lateral lean       Functional mobility during ADLs: Supervision/safety;Rolling walker (2 wheels) General ADL Comments: incr safety and balance with RW. Cues for potential spinal precuations. Pt has difficulty reaching bilat feet with figure four position     Vision Baseline Vision/History: 0 No visual deficits Vision Assessment?: No apparent visual deficits     Perception Perception: Within Functional Limits       Praxis Praxis: WFL       Pertinent Vitals/Pain Pain Assessment Pain Assessment: Faces Pain Score: 0-No pain Pain Intervention(s): Monitored during session     Extremity/Trunk Assessment Upper Extremity Assessment Upper Extremity Assessment: Overall WFL for tasks assessed   Lower  Extremity Assessment Lower Extremity Assessment: Defer to PT evaluation   Cervical / Trunk Assessment Cervical / Trunk Assessment: Back Surgery (planned 11/1)   Communication Communication Communication: No apparent difficulties   Cognition Arousal: Alert Behavior During Therapy: [name] for tasks assessed/performed Overall Cognitive Status: Within  Functional Limits for tasks assessed           General Comments  Reviewed tenative precautions post-op following spinal surgery.     Home Living Family/patient expects to be discharged to:: Private residence Living Arrangements: Spouse/significant other Available Help at Discharge: Family;Available 24 hours/day Type of Home: House Home Access: Stairs to enter [employer] of Steps: 3-4 Entrance Stairs-Rails: Right;Left Home Layout: Two level;Able to live on main level with bedroom/bathroom     Bathroom Shower/Tub: Chief Strategy Officer: Standard Bathroom Accessibility: No   Home Equipment: Cane - single point          Prior Functioning/Environment Prior Level of Function : Independent/Modified Independent;Working/employed;Driving             Mobility Comments: ind works at [employer], [employer] for gait lately ~2.5 weeks ADLs Comments: 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 States LB dressing has been increasingly difficult        OT Problem List: Decreased activity tolerance;Decreased range of motion;Impaired balance (sitting and/or standing);Decreased knowledge of precautions      OT Treatment/Interventions: Self-care/ADL training;DME and/or AE instruction;Therapeutic activities;Balance training;Patient/family education    OT Goals(Current goals can be found in the care plan section) Acute Rehab OT Goals Patient Stated Goal: to get home OT Goal Formulation: With patient Time For Goal Achievement: 11/08/23 Potential to Achieve Goals: Good ADL Goals Additional ADL Goal #1: Pt will complete all ADLs with mod I Additional ADL Goal #2: Pt will indep recall spinal precuations and carry them out during functional activities  OT Frequency: Min 1X/week       AM-PAC OT "6 Clicks" Daily Activity     Outcome Measure Help from another person eating meals?: Total (NPO) Help from another person taking care of personal grooming?: A Little Help from another person  toileting, which includes using toliet, bedpan, or urinal?: A Little Help from another person bathing (including washing, rinsing, drying)?: A Little Help from another person to put on and taking off regular upper body clothing?: A Little Help from another person to put on and taking off regular lower body clothing?: A Little 6 Click Score: 16   End of Session Nurse Communication: Mobility status  Activity Tolerance: Patient tolerated treatment well Patient left: in chair;with call bell/phone within reach;with family/visitor present  OT Visit Diagnosis: Unsteadiness on feet (R26.81)                Time: 1610-9604 OT Time Calculation (min): 16 min Charges:  OT General Charges $OT Visit: 1 Visit OT Evaluation $OT Eval Low Complexity: 1 Low  Derenda Mis, OTR/L Acute Rehabilitation Services Office [phone] Secure Chat Communication Preferred   Donia Pounds 10/25/2023, 11:41 AM

## 2023-10-25 NOTE — Anesthesia Procedure Notes (Signed)
Procedure Name: Intubation Date/Time: 10/25/2023 3:00 PM  Performed by: Little Ishikawa, CRNAPre-anesthesia Checklist: Patient identified, Emergency Drugs available, Suction available, Timeout performed and Patient being monitored Patient Re-evaluated:Patient Re-evaluated prior to induction Oxygen Delivery Method: Circle system utilized Preoxygenation: Pre-oxygenation with 100% oxygen Induction Type: IV induction Ventilation: Mask ventilation without difficulty and Oral airway inserted - appropriate to patient size Laryngoscope Size: Mac and 4 Grade View: Grade II Tube type: Oral Tube size: 7.5 mm Airway Equipment and Method: Stylet Placement Confirmation: ETT inserted through vocal cords under direct vision, positive ETCO2, CO2 detector and breath sounds checked- equal and bilateral Secured at: 23 cm Tube secured with: Tape Dental Injury: Teeth and Oropharynx as per pre-operative assessment

## 2023-10-25 NOTE — Anesthesia Postprocedure Evaluation (Signed)
Anesthesia Post Note  Patient: Charles Barrera  Procedure(s) Performed: Thoracic four-thoracic six laminectomy for resection of epidural mass (Spine Thoracic)     Patient location during evaluation: PACU Anesthesia Type: General Level of consciousness: oriented, patient cooperative and sedated Pain management: pain level controlled (pain improving) Vital Signs Assessment: post-procedure vital signs reviewed and stable Respiratory status: spontaneous breathing, nonlabored ventilation and respiratory function stable Cardiovascular status: blood pressure returned to baseline and stable Postop Assessment: no apparent nausea or vomiting Anesthetic complications: no   No notable events documented.  Last Vitals:  Vitals:   10/25/23 1945 10/25/23 2029  BP: (!) 138/92 (!) 135/91  Pulse: 84 75  Resp: 18 17  Temp: 36.7 C 36.6 C  SpO2: 96% 99%                  Zimri Brennen,E. Whitnie Deleon

## 2023-10-25 NOTE — Op Note (Signed)
Procedure(s): Thoracic four-thoracic six laminectomy for resection of epidural mass Procedure Note  Charles Barrera male 42 y.o. 10/25/2023  Procedure(s) and Anesthesia Type:    * Thoracic four-thoracic six laminectomy for resection of epidural mass - General  Surgeons and Role:    Maisie Fus, Coy Saunas, MD - Primary   Indications: This is a 42 year old man who presented to hospital with progressive with the to walk as well as leg weakness.  He was found to have diffuse bony lesions, epidural mass at T4-6 with severe cord compression.  Although there was suspicion for a myelomatous or lymphoproliferative process, given the severity of his spinal cord compression and urgent need for diagnosis, thoracic laminectomies for epidural tumor resection recommended.  Risks, benefits, alternatives, and expected convalescence were discussed with her.  Risks discussed included, but were not limited to bleeding, pain, infection, scar, spinal fluid leak, neurologic deficit, instability, damage to nearby organs, and death.  Informed consent was obtained.       Surgeon: Bedelia Person   Assistants: Patrici Ranks, PA.  Please note no qualified trainees were available to assist with the procedure.  Anesthesia: General endotracheal anesthesia   Procedure Detail The patient was brought to the operating room.  A timeout was performed.  General anesthesia was induced the patient intubated by the anesthesiologist.  Appropriate lines and monitors were placed and patient was positioned prone all pressure points padded and eyes protected.  C-arm x-ray was used to plan an incision over the T4-6 lamina.  The area was prepped and draped in sterile fashion.  Incision was made with a 10 blade and monopolar electrocautery was used to open the fascia and dissected paraspinous muscles off the T4-T5 and T6 lamina in subperiosteal fashion.  Localizing x-ray was obtained with C arm which confirmed appropriate localization.   Spinous process and lamina were removed with rongeur's, performing T4, T5, and T6 laminectomies.  Bone was harvested and sent as best none.  Right medial facetectomies was performed with high-speed drill.  Ligamentum flavum was removed.  A somewhat hypervascular soft tissue mass infiltrating through the epidural fat was noted.  It was extending into the right foramina.  Microscope was introduced in the field to allow for intraoperative microdissection.  The mass was carefully peeled away from the dorsal thecal sac and removed piecemeal.  It was sent for frozen section which returned as a lymphoproliferative or plasmacytic infiltrate.  Tumor extending out the foramina was also resected in piecemeal fashion. Tumor was noted to be fully enveloping the right T5 nerve root.  The nerve root was skeletonized but the adherence to the nerve sleeve at its exit from the common dural tube was very tight so small residual tumor was left around the nerve root was bipolared.  Thin layer of tumor in the ventral epidural space on the right side was noted.  Some of this was bipolared as well.  Following resection of the dorsal and right foraminal/lateral recess mass, the thecal sac had returned to its normal anatomic position and appeared well decompressed.  Meticulous hemostasis was obtained.  The wound was irrigated thoroughly.  The muscle was closed with 0 Vicryl stitches.  The fascia was closed with 2-0 Vicryl stitches.  The dermal layer was closed with 2-0 Vicryl stitches in buried interrupted fashion.  The skin was closed with 4 Monocryl in subcuticular manner followed by Dermabond.  A sterile dressing was then placed.  Patient was then flipped supine and extubated by anesthesia service.  All  counts were correct at the end of surgery.  No complications were noted.   Thoracic four, thoracic five and thoracic six laminectomy, right medial facetectomies Resection of epidural mass T4-5-6 Use of intraoperative  microscope  Findings: Epidural soft tissue mass infiltrating through the epidural fat.  Frozen section consistent plasmacytic or lymphoproliferative mass.  T5 nerve root enveloped with tumor, small residual tumor left around nerve root and thin layer in right ventral epidural space.   Estimated Blood Loss:  less than 100 mL         Drains: none  Blood Given: none          Specimens:  Thoracic spinous process/lamina Epidural mass         Implants: none        Complications:  * No complications entered in OR log *         Disposition: PACU - hemodynamically stable.         Condition: stable

## 2023-10-25 NOTE — Progress Notes (Signed)
Pt seen and examined.  Exam unchanged, 4/5 strength in LEs except 3/5 DF, T4 sensory level.  All questions answered.  Plan for resection of epidural mass

## 2023-10-25 NOTE — Evaluation (Signed)
Physical Therapy Evaluation Patient Details Name: Charles Barrera MRN: [id number] DOB: 1981/05/04 Today's Date: 10/25/2023  History of Present Illness  42 y.o. male admitted 10/30 with low back pain and weakness. 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 Plan for spinal surgery 11/1. No significant PMHx:   Clinical Impression  Pt admitted with above diagnosis. 42/56 BERG, mobilized safely with RW for support. Demonstrates unsteady gait without assistive device. Has 24/7 supervision at home. Will assess post-op when appropriate to reassess function.  Pt currently with functional limitations due to the deficits listed below (see PT Problem List). Pt will benefit from acute skilled PT to increase their independence and safety with mobility to allow discharge.           If plan is discharge home, recommend the following: A little help with walking and/or transfers;A little help with bathing/dressing/bathroom;Assist for transportation   Can travel by private vehicle     yes    Equipment Recommendations Rolling walker (2 wheels)  Recommendations for Other Services       Functional Status Assessment Patient has had a recent decline in their functional status and demonstrates the ability to make significant improvements in function in a reasonable and predictable amount of time.     Precautions / Restrictions Precautions Precautions: Fall Restrictions Weight Bearing Restrictions: No      Mobility  Bed Mobility               General bed mobility comments: in recliner    Transfers Overall transfer level: Needs assistance Equipment used: Rolling walker (2 wheels), None Transfers: Sit to/from Stand Sit to Stand: Supervision           General transfer comment: Supervision for safety from recliner, stands from  EOB without UE support.    Ambulation/Gait Ambulation/Gait assistance: Supervision, Contact guard assist Gait Distance (Feet): 80 Feet Assistive device: Rolling walker (2 wheels), None Gait Pattern/deviations: Step-through pattern, Decreased stride length, Wide base of support, Ataxic Gait velocity: decr Gait velocity interpretation: <1.8 ft/sec, indicate of risk for recurrent falls   General Gait Details: Adjusted and educated pt on RW use. Stable with this device, able to manipulate in congested areas of room, no drift. Without AD pt demonstrates Wide BOS, ataxic steps, minor drift, able to self correct but requires CGA for safety.  Stairs            Wheelchair Mobility     Tilt Bed    Modified Rankin (Stroke Patients Only)       Balance Overall balance assessment: Needs assistance Sitting-balance support: No upper extremity supported, Feet supported Sitting balance-Leahy Scale: Good     Standing balance support: No upper extremity supported Standing balance-Leahy Scale: Fair                   Standardized Balance Assessment Standardized Balance Assessment : Berg Balance Test Berg Balance Test Sit to Stand: Able to stand without using hands and stabilize independently Standing Unsupported: Able to stand safely 2 minutes Sitting with Back Unsupported but Feet Supported on Floor or Stool: Able to sit safely and securely 2 minutes Stand to Sit: Sits safely with minimal use of hands Transfers: Able to transfer safely, minor use of hands Standing Unsupported with Eyes Closed: Able to stand 10 seconds safely Standing Ubsupported with Feet Together: Able to place feet  together independently and stand 1 minute safely From Standing, Reach Forward with Outstretched Arm: Can reach confidently >25 cm (10") From Standing Position, Pick up Object from Floor: Able to pick up shoe, needs supervision From Standing Position, Turn to Look Behind Over each Shoulder: Looks  behind from both sides and weight shifts well Turn 360 Degrees: Able to turn 360 degrees safely but slowly Standing Unsupported, Alternately Place Feet on Step/Stool: Able to stand independently and complete 8 steps >20 seconds Standing Unsupported, One Foot in Front: Able to plae foot ahead of the other independently and hold 30 seconds Standing on One Leg: Tries to lift leg/unable to hold 3 seconds but remains standing independently Total Score: 48         Pertinent Vitals/Pain Pain Assessment Pain Assessment: 0-10 Pain Score: 0-No pain Pain Location: Back Pain Descriptors / Indicators: Aching Pain Intervention(s): Monitored during session, Repositioned    Home Living Family/patient expects to be discharged to:: Private residence Living Arrangements: Spouse/significant other Available Help at Discharge: Family;Available 24 hours/day Type of Home: House Home Access: Stairs to enter Entrance Stairs-Rails: Doctor, general practice of Steps: 3-4   Home Layout: Two level;Able to live on main level with bedroom/bathroom Home Equipment: Gilmer Mor - single point      Prior Function Prior Level of Function : Independent/Modified Independent;Working/employed;Driving             Mobility Comments: ind works at [employer], [facility] for gait lately ~2.5 weeks ADLs Comments: ind works at [employer]     Extremity/Trunk Assessment   Upper Extremity Assessment Upper Extremity Assessment: Defer to OT evaluation    Lower Extremity Assessment Lower Extremity Assessment: Generalized weakness (Lt hip flexor 4/5)       Communication   Communication Communication: No apparent difficulties  Cognition Arousal: Alert Behavior During Therapy: WFL for tasks assessed/performed Overall Cognitive Status: Within Functional Limits for tasks assessed                                          General Comments General comments (skin integrity, edema, etc.): Reviewed tenative  precautions post-op following spinal surgery.    Exercises     Assessment/Plan    PT Assessment Patient needs continued PT services  PT Problem List         PT Treatment Interventions DME instruction;Gait training;Stair training;Functional mobility training;Therapeutic activities;Therapeutic exercise;Balance training;Neuromuscular re-education;Patient/family education;Wheelchair mobility training;Modalities    PT Goals (Current goals can be found in the Care Plan section)  Acute Rehab PT Goals Patient Stated Goal: Wonders if he can work from home. D/c home PT Goal Formulation: With patient Time For Goal Achievement: 11/08/23 Potential to Achieve Goals: Good    Frequency Min 1X/week     Co-evaluation               AM-PAC PT "6 Clicks" Mobility  Outcome Measure Help needed turning from your back to your side while in a flat bed without using bedrails?: None Help needed moving from lying on your back to sitting on the side of a flat bed without using bedrails?: None Help needed moving to and from a bed to a chair (including a wheelchair)?: None Help needed standing up from a chair using your arms (e.g., wheelchair or bedside chair)?: A Little Help needed to walk in hospital room?: A Little Help needed climbing 3-5 steps with a railing? : A Little  6 Click Score: 21    End of Session   Activity Tolerance: Patient tolerated treatment well Patient left: in chair;with call bell/phone within reach   PT Visit Diagnosis: Unsteadiness on feet (R26.81);Other abnormalities of gait and mobility (R26.89);Ataxic gait (R26.0);Other symptoms and signs involving the nervous system (R29.898);Pain Pain - part of body:  (back)    Time: 1610-9604 PT Time Calculation (min) (ACUTE ONLY): 30 min   Charges:   PT Evaluation $PT Eval Low Complexity: 1 Low PT Treatments $Gait Training: 8-22 mins PT General Charges $$ ACUTE PT VISIT: 1 Visit         Kathlyn Sacramento, PT, DPT [facility]  Health  Rehabilitation Services Physical Therapist Office: [phone] Website: [email or web address]   Berton Mount 10/25/2023, 11:20 AM

## 2023-10-26 DIAGNOSIS — G9520 Unspecified cord compression: Secondary | ICD-10-CM | POA: Diagnosis not present

## 2023-10-26 LAB — BASIC METABOLIC PANEL
Anion gap: 8 (ref 5–15)
BUN: 13 mg/dL (ref 6–20)
CO2: 24 mmol/L (ref 22–32)
Calcium: 8.6 mg/dL — ABNORMAL LOW (ref 8.9–10.3)
Chloride: 105 mmol/L (ref 98–111)
Creatinine, Ser: 0.97 mg/dL (ref 0.61–1.24)
GFR, Estimated: >60 mL/min (ref >60)
Glucose, Bld: 140 mg/dL — ABNORMAL HIGH (ref 70–99)
Potassium: 4.3 mmol/L (ref 3.5–5.1)
Sodium: 137 mmol/L (ref 135–145)

## 2023-10-26 LAB — CBC
HCT: 38 % — ABNORMAL LOW (ref 39.0–52.0)
Hemoglobin: 12.2 g/dL — ABNORMAL LOW (ref 13.0–17.0)
MCH: 27.7 pg (ref 26.0–34.0)
MCHC: 32.1 g/dL (ref 30.0–36.0)
MCV: 86.2 fL (ref 80.0–100.0)
Platelets: 212 10*3/uL (ref 150–400)
RBC: 4.41 MIL/uL (ref 4.22–5.81)
RDW: 13.3 % (ref 11.5–15.5)
WBC: 16 10*3/uL — ABNORMAL HIGH (ref 4.0–10.5)
nRBC: 0 % (ref 0.0–0.2)

## 2023-10-26 MED ORDER — FLEET ENEMA RE ENEM: 2.0000 | ENEMA | Freq: Once | RECTAL | Status: DC

## 2023-10-26 MED ORDER — FLEET ENEMA RE ENEM
2.0000 | ENEMA | Freq: Once | RECTAL | Status: AC
  Administered 2023-10-27: 2 via RECTAL
  Filled 2023-10-26: qty 2

## 2023-10-26 NOTE — H&P (View-Only) (Signed)
UNASSIGNED CONSULT NOTE FOR [name]  Reason for Consult: ? Rectosigmoid lesion Referring Physician: [facility]  Charles Barrera HPI: This is a 42 year old male without any PMH admitted for a high grade spinal cord compression.  Starting in February 2024 he complained about back pain.  His ambulation over this time worsened to the point that he required a cane, but he felt that he was going to be in a wheelchair.  MRI of the back showed metastatic disease affecting all vertebral bodies and an extraosseous mass in the posterior right side of the spinal canal extending from T4-T6.  There was concern for MM, but other etiologies needed to be entertained.  He had a laminectomy yesterday and the frozen section was positive for lymphoproliferative or plasmacytic infiltrate.  A staging CT scan was performed and there a finding of a focal narrowing at the rectosigmoid junction.  It was not clear if it was a mass, stricture, or muscular hypertrophy.  The patient denies any issues with hematochezia, melena, changes to his bowel habits, or a family history of colon cancer.  History reviewed. No pertinent past medical history.  History reviewed. No pertinent surgical history.  History reviewed. No pertinent family history.  Social History:  reports that he has never smoked. He has never used smokeless tobacco. He reports current alcohol use. He reports that he does not currently use drugs.  Allergies: No Known Allergies  Medications: Scheduled:  dexamethasone  6 mg Oral Q6H   pregabalin  150 mg Oral BID   sodium chloride flush  3 mL Intravenous Q12H   Continuous:  sodium chloride      Results for orders placed or performed during the hospital encounter of 10/23/23 (from the past 24 hour(s))  Basic metabolic panel     Status: Abnormal   Collection Time: 10/26/23  5:25 AM  Result Value Ref Range   Sodium 137 135 - 145 mmol/L   Potassium 4.3 3.5 - 5.1 mmol/L   Chloride 105 98 - 111 mmol/L    CO2 24 22 - 32 mmol/L   Glucose, Bld 140 (H) 70 - 99 mg/dL   BUN 13 6 - 20 mg/dL   Creatinine, Ser 4.54 0.61 - 1.24 mg/dL   Calcium 8.6 (L) 8.9 - 10.3 mg/dL   GFR, Estimated >09 >81 mL/min   Anion gap 8 5 - 15  CBC     Status: Abnormal   Collection Time: 10/26/23  5:25 AM  Result Value Ref Range   WBC 16.0 (H) 4.0 - 10.5 K/uL   RBC 4.41 4.22 - 5.81 MIL/uL   Hemoglobin 12.2 (L) 13.0 - 17.0 g/dL   HCT 19.1 (L) 47.8 - 29.5 %   MCV 86.2 80.0 - 100.0 fL   MCH 27.7 26.0 - 34.0 pg   MCHC 32.1 30.0 - 36.0 g/dL   RDW 62.1 30.8 - 65.7 %   Platelets 212 150 - 400 K/uL   nRBC 0.0 0.0 - 0.2 %     DG Thoracic Spine 2 View  Result Date: 10/25/2023 CLINICAL DATA:  Thoracic spine surgery. EXAM: THORACIC SPINE 2 VIEWS COMPARISON:  None Available. FINDINGS: Single fluoroscopic spot images provided. The total fluoroscopic time is 56.8 second with cumulative air Karma of 25 mGy. Orthopedic hardware noted. IMPRESSION: Fluoroscopic spot images during thoracic spine surgery. Electronically Signed   By: Elgie Collard M.D.   On: 10/25/2023 22:52   DG C-Arm 1-60 Min-No Report  Result Date: 10/25/2023 Fluoroscopy was utilized by  the requesting physician.  No radiographic interpretation.   DG C-Arm 1-60 Min-No Report  Result Date: 10/25/2023 Fluoroscopy was utilized by the requesting physician.  No radiographic interpretation.   DG C-Arm 1-60 Min-No Report  Result Date: 10/25/2023 Fluoroscopy was utilized by the requesting physician.  No radiographic interpretation.   CT CHEST ABDOMEN PELVIS W CONTRAST  Result Date: 10/25/2023 CLINICAL DATA:  Musculoskeletal neoplasm for staging. EXAM: CT CHEST, ABDOMEN, AND PELVIS WITH CONTRAST TECHNIQUE: Multidetector CT imaging of the chest, abdomen and pelvis was performed following the standard protocol during bolus administration of intravenous contrast. RADIATION DOSE REDUCTION: This exam was performed according to the departmental dose-optimization program  which includes automated exposure control, adjustment of the mA and/or kV according to patient size and/or use of iterative reconstruction technique. CONTRAST:  75mL OMNIPAQUE IOHEXOL 350 MG/ML SOLN COMPARISON:  Chest radiograph 07/19/2018. MRI thoracic and lumbar spine 10/23/2023 FINDINGS: CT CHEST FINDINGS Cardiovascular: No significant vascular findings. Normal heart size. No pericardial effusion. Mediastinum/Nodes: No enlarged mediastinal, hilar, or axillary lymph nodes. Thyroid gland, trachea, and esophagus demonstrate no significant findings. Lungs/Pleura: Lungs are clear. No pleural effusion or pneumothorax. Musculoskeletal: Heterogeneous sclerosis throughout the thoracic vertebrae and sternum consistent with diffuse metastatic disease or myeloma. This is better demonstrated on prior MRI. See previous report. Nodular soft tissue process along the left posterior paraspinal space extending from T4 through T8 consistent with primary or metastatic disease. See also report of previous thoracic spine MRI. CT ABDOMEN PELVIS FINDINGS Hepatobiliary: No focal liver abnormality is seen. No gallstones, gallbladder wall thickening, or biliary dilatation. Pancreas: Unremarkable. No pancreatic ductal dilatation or surrounding inflammatory changes. Spleen: Normal in size without focal abnormality. Adrenals/Urinary Tract: Adrenal glands are unremarkable. Kidneys are normal, without renal calculi, focal lesion, or hydronephrosis. Bladder is unremarkable. Stomach/Bowel: Stomach, small bowel, and colon are not abnormally distended. Stool diffusely fills the colon. There is a focal area of narrowing and shouldering of the colon at the rectosigmoid junction. Proximal and distal colon/rectum at this level are diffusely stool-filled. This could represent a focal neoplasm, stricture, or muscular hypertrophy. Appendix is normal. Vascular/Lymphatic: Minimal aortic calcification. No aneurysm. No significant lymphadenopathy.  Reproductive: Prostate is unremarkable. Other: No abdominal wall hernia or abnormality. No abdominopelvic ascites. Musculoskeletal: Heterogeneous sclerosis throughout the lumbar vertebrae, sacrum, and pelvis consistent with diffuse metastatic disease or myeloma. See also report of prior lumbar spine MRI. IMPRESSION: 1. Diffuse heterogeneous bone sclerosis likely indicating metastatic disease or myeloma. See additional report of MRI thoracic and lumbar spine. 2. Left paraspinal mass extending from T4 through T8 level. 3. Focal narrowing of the colon at the rectosigmoid junction, possibly mass, stricture, or muscular hypertrophy. Consider endoscopy if clinically indicated. Electronically Signed   By: Burman Nieves M.D.   On: 10/25/2023 16:18    ROS:  As stated above in the HPI otherwise negative.  Blood pressure 117/69, pulse 71, temperature 98.5 F (36.9 C), temperature source Oral, resp. rate 16, height 6\' 2"  (1.88 m), weight 120.2 kg, SpO2 98%.    PE: Gen: NAD, Alert and Oriented HEENT:  [place]/AT, EOMI Neck: Supple, no LAD Lungs: CTA Bilaterally CV: RRR without M/G/R ABD: Soft, NTND, +BS Ext: No C/C/E  Assessment/Plan: 1) Abnormal CT scan. 2) Diffuse metastatic disease of unknown etiology.  Suspicious for MM. 3) LE weakness.   With the current imaging findings and the frozen section from yesterday's surgery, it is less likely that he has a colonic pathology.  However, it is reasonable to perform a flexible sigmoidoscopy  to exclude any abnormalities or additional problems.  Plan: 1) FFS tomorrow.  Charles Barrera D 10/26/2023, 10:11 AM

## 2023-10-26 NOTE — Anesthesia Preprocedure Evaluation (Addendum)
Anesthesia Evaluation  Patient identified by MRN, date of birth, ID band Patient awake    Reviewed: Allergy & Precautions, H&P , NPO status , Patient's Chart, lab work & pertinent test results  Airway Mallampati: II  TM Distance: >3 FB Neck ROM: Full    Dental no notable dental hx.    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  negative psych ROS   GI/Hepatic negative GI ROS, Neg liver ROS,,,  Endo/Other  negative endocrine ROS    Renal/GU negative Renal ROS  negative genitourinary   Musculoskeletal negative musculoskeletal ROS (+)    Abdominal   Peds negative pediatric ROS (+)  Hematology negative hematology ROS (+)   Anesthesia Other Findings   Reproductive/Obstetrics negative OB ROS                              Anesthesia Physical Anesthesia Plan  ASA: 3  Anesthesia Plan: General   Post-op Pain Management:    Induction: Intravenous  PONV Risk Score and Plan: Ondansetron and Dexamethasone  Airway Management Planned: Oral ETT  Additional Equipment:   Intra-op Plan:   Post-operative Plan: Extubation in OR  Informed Consent: I have reviewed the patients History and Physical, chart, labs and discussed the procedure including the risks, benefits and alternatives for the proposed anesthesia with the patient or authorized representative who has indicated his/her understanding and acceptance.     Dental advisory given  Plan Discussed with: CRNA  Anesthesia Plan Comments:          Anesthesia Quick Evaluation

## 2023-10-26 NOTE — Consult Note (Signed)
UNASSIGNED CONSULT NOTE FOR [name]  Reason for Consult: ? Rectosigmoid lesion Referring Physician: [facility]  Alexia Freestone HPI: This is a 42 year old male without any PMH admitted for a high grade spinal cord compression.  Starting in February 2024 he complained about back pain.  His ambulation over this time worsened to the point that he required a cane, but he felt that he was going to be in a wheelchair.  MRI of the back showed metastatic disease affecting all vertebral bodies and an extraosseous mass in the posterior right side of the spinal canal extending from T4-T6.  There was concern for MM, but other etiologies needed to be entertained.  He had a laminectomy yesterday and the frozen section was positive for lymphoproliferative or plasmacytic infiltrate.  A staging CT scan was performed and there a finding of a focal narrowing at the rectosigmoid junction.  It was not clear if it was a mass, stricture, or muscular hypertrophy.  The patient denies any issues with hematochezia, melena, changes to his bowel habits, or a family history of colon cancer.  History reviewed. No pertinent past medical history.  History reviewed. No pertinent surgical history.  History reviewed. No pertinent family history.  Social History:  reports that he has never smoked. He has never used smokeless tobacco. He reports current alcohol use. He reports that he does not currently use drugs.  Allergies: No Known Allergies  Medications: Scheduled:  dexamethasone  6 mg Oral Q6H   pregabalin  150 mg Oral BID   sodium chloride flush  3 mL Intravenous Q12H   Continuous:  sodium chloride      Results for orders placed or performed during the hospital encounter of 10/23/23 (from the past 24 hour(s))  Basic metabolic panel     Status: Abnormal   Collection Time: 10/26/23  5:25 AM  Result Value Ref Range   Sodium 137 135 - 145 mmol/L   Potassium 4.3 3.5 - 5.1 mmol/L   Chloride 105 98 - 111 mmol/L    CO2 24 22 - 32 mmol/L   Glucose, Bld 140 (H) 70 - 99 mg/dL   BUN 13 6 - 20 mg/dL   Creatinine, Ser 4.54 0.61 - 1.24 mg/dL   Calcium 8.6 (L) 8.9 - 10.3 mg/dL   GFR, Estimated >09 >81 mL/min   Anion gap 8 5 - 15  CBC     Status: Abnormal   Collection Time: 10/26/23  5:25 AM  Result Value Ref Range   WBC 16.0 (H) 4.0 - 10.5 K/uL   RBC 4.41 4.22 - 5.81 MIL/uL   Hemoglobin 12.2 (L) 13.0 - 17.0 g/dL   HCT 19.1 (L) 47.8 - 29.5 %   MCV 86.2 80.0 - 100.0 fL   MCH 27.7 26.0 - 34.0 pg   MCHC 32.1 30.0 - 36.0 g/dL   RDW 62.1 30.8 - 65.7 %   Platelets 212 150 - 400 K/uL   nRBC 0.0 0.0 - 0.2 %     DG Thoracic Spine 2 View  Result Date: 10/25/2023 CLINICAL DATA:  Thoracic spine surgery. EXAM: THORACIC SPINE 2 VIEWS COMPARISON:  None Available. FINDINGS: Single fluoroscopic spot images provided. The total fluoroscopic time is 56.8 second with cumulative air Karma of 25 mGy. Orthopedic hardware noted. IMPRESSION: Fluoroscopic spot images during thoracic spine surgery. Electronically Signed   By: Elgie Collard M.D.   On: 10/25/2023 22:52   DG C-Arm 1-60 Min-No Report  Result Date: 10/25/2023 Fluoroscopy was utilized by  the requesting physician.  No radiographic interpretation.   DG C-Arm 1-60 Min-No Report  Result Date: 10/25/2023 Fluoroscopy was utilized by the requesting physician.  No radiographic interpretation.   DG C-Arm 1-60 Min-No Report  Result Date: 10/25/2023 Fluoroscopy was utilized by the requesting physician.  No radiographic interpretation.   CT CHEST ABDOMEN PELVIS W CONTRAST  Result Date: 10/25/2023 CLINICAL DATA:  Musculoskeletal neoplasm for staging. EXAM: CT CHEST, ABDOMEN, AND PELVIS WITH CONTRAST TECHNIQUE: Multidetector CT imaging of the chest, abdomen and pelvis was performed following the standard protocol during bolus administration of intravenous contrast. RADIATION DOSE REDUCTION: This exam was performed according to the departmental dose-optimization program  which includes automated exposure control, adjustment of the mA and/or kV according to patient size and/or use of iterative reconstruction technique. CONTRAST:  75mL OMNIPAQUE IOHEXOL 350 MG/ML SOLN COMPARISON:  Chest radiograph 07/19/2018. MRI thoracic and lumbar spine 10/23/2023 FINDINGS: CT CHEST FINDINGS Cardiovascular: No significant vascular findings. Normal heart size. No pericardial effusion. Mediastinum/Nodes: No enlarged mediastinal, hilar, or axillary lymph nodes. Thyroid gland, trachea, and esophagus demonstrate no significant findings. Lungs/Pleura: Lungs are clear. No pleural effusion or pneumothorax. Musculoskeletal: Heterogeneous sclerosis throughout the thoracic vertebrae and sternum consistent with diffuse metastatic disease or myeloma. This is better demonstrated on prior MRI. See previous report. Nodular soft tissue process along the left posterior paraspinal space extending from T4 through T8 consistent with primary or metastatic disease. See also report of previous thoracic spine MRI. CT ABDOMEN PELVIS FINDINGS Hepatobiliary: No focal liver abnormality is seen. No gallstones, gallbladder wall thickening, or biliary dilatation. Pancreas: Unremarkable. No pancreatic ductal dilatation or surrounding inflammatory changes. Spleen: Normal in size without focal abnormality. Adrenals/Urinary Tract: Adrenal glands are unremarkable. Kidneys are normal, without renal calculi, focal lesion, or hydronephrosis. Bladder is unremarkable. Stomach/Bowel: Stomach, small bowel, and colon are not abnormally distended. Stool diffusely fills the colon. There is a focal area of narrowing and shouldering of the colon at the rectosigmoid junction. Proximal and distal colon/rectum at this level are diffusely stool-filled. This could represent a focal neoplasm, stricture, or muscular hypertrophy. Appendix is normal. Vascular/Lymphatic: Minimal aortic calcification. No aneurysm. No significant lymphadenopathy.  Reproductive: Prostate is unremarkable. Other: No abdominal wall hernia or abnormality. No abdominopelvic ascites. Musculoskeletal: Heterogeneous sclerosis throughout the lumbar vertebrae, sacrum, and pelvis consistent with diffuse metastatic disease or myeloma. See also report of prior lumbar spine MRI. IMPRESSION: 1. Diffuse heterogeneous bone sclerosis likely indicating metastatic disease or myeloma. See additional report of MRI thoracic and lumbar spine. 2. Left paraspinal mass extending from T4 through T8 level. 3. Focal narrowing of the colon at the rectosigmoid junction, possibly mass, stricture, or muscular hypertrophy. Consider endoscopy if clinically indicated. Electronically Signed   By: Burman Nieves M.D.   On: 10/25/2023 16:18    ROS:  As stated above in the HPI otherwise negative.  Blood pressure 117/69, pulse 71, temperature 98.5 F (36.9 C), temperature source Oral, resp. rate 16, height 6\' 2"  (1.88 m), weight 120.2 kg, SpO2 98%.    PE: Gen: NAD, Alert and Oriented HEENT:  [place]/AT, EOMI Neck: Supple, no LAD Lungs: CTA Bilaterally CV: RRR without M/G/R ABD: Soft, NTND, +BS Ext: No C/C/E  Assessment/Plan: 1) Abnormal CT scan. 2) Diffuse metastatic disease of unknown etiology.  Suspicious for MM. 3) LE weakness.   With the current imaging findings and the frozen section from yesterday's surgery, it is less likely that he has a colonic pathology.  However, it is reasonable to perform a flexible sigmoidoscopy  to exclude any abnormalities or additional problems.  Plan: 1) FFS tomorrow.  Corneilus Heggie D 10/26/2023, 10:11 AM

## 2023-10-26 NOTE — Anesthesia Preprocedure Evaluation (Signed)
Anesthesia Evaluation  Patient identified by MRN, date of birth, ID band Patient awake    Reviewed: Allergy & Precautions, NPO status , Patient's Chart, lab work & pertinent test results  Airway Mallampati: III  TM Distance: >3 FB Neck ROM: Full    Dental  (+) Teeth Intact, Dental Advisory Given   Pulmonary neg pulmonary ROS   Pulmonary exam normal breath sounds clear to auscultation       Cardiovascular negative cardio ROS Normal cardiovascular exam Rhythm:Regular Rate:Normal     Neuro/Psych Presented with progressive back pain and b/l LE weakness and has MRI findings concerning for metastatic disease leading to spinal cord compression- now s/p T4-6 laminectomy for resection of epidural mass, frozen section thus far consistent with plasmacytic or lymphoproliferative mass  Decadron q6h  negative neurological ROS  negative psych ROS   GI/Hepatic Neg liver ROS,,,Abnormal CT in the setting of new spinal cord tumor: focal narrowing at the rectosigmoid junction, possibly mass, stricture, or muscular hypertrophy. Appreciate input by GI. Less likely he has colonic pathology given frozen section finding. However, pt is planned for flex sig 11/3 to exclude any abnormalities or additional problems   Endo/Other  Obesity BMI 34  Renal/GU negative Renal ROS  negative genitourinary   Musculoskeletal negative musculoskeletal ROS (+)    Abdominal  (+) + obese  Peds  Hematology negative hematology ROS (+)   Anesthesia Other Findings   Reproductive/Obstetrics negative OB ROS                             Anesthesia Physical Anesthesia Plan  ASA: 3  Anesthesia Plan: MAC   Post-op Pain Management:    Induction: Intravenous  PONV Risk Score and Plan: Propofol infusion, TIVA, Treatment may vary due to age or medical condition and Midazolam  Airway Management Planned: Natural Airway and Simple Face  Mask  Additional Equipment: None  Intra-op Plan:   Post-operative Plan:   Informed Consent: I have reviewed the patients History and Physical, chart, labs and discussed the procedure including the risks, benefits and alternatives for the proposed anesthesia with the patient or authorized representative who has indicated his/her understanding and acceptance.       Plan Discussed with: CRNA  Anesthesia Plan Comments:        Anesthesia Quick Evaluation

## 2023-10-26 NOTE — Progress Notes (Signed)
Physical Therapy Treatment Patient Details Name: Charles Barrera MRN: [id number] DOB: 03-02-1981 Today's Date: 10/26/2023   History of Present Illness 42 y.o. male admitted 10/30 with low back pain and weakness. 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 Underwent T4-T6 lami with mass resection 11/1. No significant PMHx:    PT Comments  Seen post-op today, pt without any substantial changes in function. He is a little guarded with movements due to pain. Ambulatory with and without a RW however demonstrates improved stability and confidence with RW use at this time. BERG decreased but secondary to spinal precautions. I suspect it would be unchanged if it were for these restrictions. Would likely benefit from OPPT to improve strength, gait, balance, and gross function. Patient will continue to benefit from skilled physical therapy services to further improve independence with functional mobility.     If plan is discharge home, recommend the following: A little help with walking and/or transfers;A little help with bathing/dressing/bathroom;Assist for transportation   Can travel by private vehicle        Equipment Recommendations  Rolling walker (2 wheels)    Recommendations for Other Services       Precautions / Restrictions Precautions Precautions: Fall;Back Precaution Comments: Reviewed Required Braces or Orthoses:  (No brace needed) Restrictions Weight Bearing Restrictions: No     Mobility  Bed Mobility               General bed mobility comments: OOB upoon arrival    Transfers Overall transfer level: Needs assistance Equipment used: Rolling walker (2 wheels) Transfers: Sit to/from Stand Sit to Stand: Supervision           General transfer comment: Supervision for safety from recliner. Guarded and  slow from pain    Ambulation/Gait Ambulation/Gait assistance: Supervision Gait Distance (Feet): 90 Feet Assistive device: Rolling walker (2 wheels), None Gait Pattern/deviations: Step-through pattern, Decreased stride length, Wide base of support Gait velocity: decr Gait velocity interpretation: <1.8 ft/sec, indicate of risk for recurrent falls   General Gait Details: Slow and guarded. With RW pt moves safely without evidence of LOB. Without RW pt very slow and cautious with each step. Appears more symmetrical post-op. Wide BOS, hesitant but no overt buckling or LOB during bout.   Stairs             Wheelchair Mobility     Tilt Bed    Modified Rankin (Stroke Patients Only)       Balance Overall balance assessment: Needs assistance Sitting-balance support: No upper extremity supported, Feet supported Sitting balance-Leahy Scale: Good     Standing balance support: No upper extremity supported Standing balance-Leahy Scale: Fair                   Standardized Balance Assessment Standardized Balance Assessment : Berg Balance Test Berg Balance Test Sit to Stand: Able to stand  independently using hands (pain limited) Standing Unsupported: Able to stand safely 2 minutes Sitting with Back Unsupported but Feet Supported on Floor or Stool: Able to sit safely and securely 2 minutes Stand to Sit: Sits safely with minimal use of hands Transfers: Able to transfer safely, minor use of hands Standing Unsupported with Eyes Closed: Able to stand 10 seconds safely Standing Ubsupported with Feet Together: Able to place feet together independently and stand 1 minute safely From Standing, Reach Forward  with Outstretched Arm: Can reach confidently >25 cm (10") From Standing Position, Pick up Object from Floor: Unable to try/needs assist to keep balance (restrictions from back surgery) From Standing Position, Turn to Look Behind Over each Shoulder: Needs assist to keep from  losing balance and falling (restrictions from back surgery) Turn 360 Degrees: Able to turn 360 degrees safely but slowly Standing Unsupported, Alternately Place Feet on Step/Stool: Able to stand independently and complete 8 steps >20 seconds Standing Unsupported, One Foot in Front: Able to place foot tandem independently and hold 30 seconds Standing on One Leg: Tries to lift leg/unable to hold 3 seconds but remains standing independently Total Score: 41        Cognition Arousal: Alert Behavior During Therapy: WFL for tasks assessed/performed Overall Cognitive Status: Within Functional Limits for tasks assessed                                          Exercises General Exercises - Lower Extremity Ankle Circles/Pumps: AROM, Both, 10 reps, Seated Long Arc Quad: Strengthening, Both, Seated, 10 reps    General Comments        Pertinent Vitals/Pain Pain Assessment Pain Location: Back Pain Descriptors / Indicators: Aching    Home Living                          Prior Function            PT Goals (current goals can now be found in the care plan section) Acute Rehab PT Goals Patient Stated Goal: Wonders if he can work from home. D/c home PT Goal Formulation: With patient Time For Goal Achievement: 11/08/23 Potential to Achieve Goals: Good Progress towards PT goals: Progressing toward goals    Frequency    Min 1X/week      PT Plan      Co-evaluation              AM-PAC PT "6 Clicks" Mobility   Outcome Measure  Help needed turning from your back to your side while in a flat bed without using bedrails?: None Help needed moving from lying on your back to sitting on the side of a flat bed without using bedrails?: None Help needed moving to and from a bed to a chair (including a wheelchair)?: None Help needed standing up from a chair using your arms (e.g., wheelchair or bedside chair)?: A Little Help needed to walk in hospital room?: A  Little Help needed climbing 3-5 steps with a railing? : A Little 6 Click Score: 21    End of Session Equipment Utilized During Treatment: Gait belt Activity Tolerance: Patient tolerated treatment well Patient left: in chair;with call bell/phone within reach;with family/visitor present;with SCD's reapplied Nurse Communication: Mobility status PT Visit Diagnosis: Unsteadiness on feet (R26.81);Other abnormalities of gait and mobility (R26.89);Ataxic gait (R26.0);Other symptoms and signs involving the nervous system (R29.898);Pain Pain - part of body:  (back)     Time: 1610-9604 PT Time Calculation (min) (ACUTE ONLY): 17 min  Charges:    $Gait Training: 8-22 mins PT General Charges $$ ACUTE PT VISIT: 1 Visit                     Kathlyn [place], PT, DPT [facility]  Rehabilitation Services Physical Therapist Office: [phone] Website: [email or web address]    Berton Mount 10/26/2023,  10:31 AM

## 2023-10-26 NOTE — Progress Notes (Signed)
Neurosurgery Service Progress Note  Subjective: No acute events overnight. States that he is feeling better and significantly stronger compared to pre-op. Has some incisional pain, which is to be expected.    Objective: Vitals:   10/25/23 1945 10/25/23 2029 10/26/23 0012 10/26/23 0335  BP: (!) 138/92 (!) 135/91 (!) 101/54 117/69  Pulse: 84 75 81 71  Resp: 18 17 16 16   Temp: 98 F (36.7 C) 97.9 F (36.6 C) 98.6 F (37 C) 98.5 F (36.9 C)  TempSrc:  Oral Oral Oral  SpO2: 96% 99% 96% 98%  Weight:      Height:        Physical Exam: Significantly improved compared to pre-op - strength 5/5 x4 and SILTx4, incision c/d/I   Assessment & Plan: 42 y.o. male s/p T4-6 laminectomy for resection of epidural mass, recovering well.  -PT/OT recs  -OK for DVT chemoppx on 10/27/23 -continue medical management per attending service  -stable for discharge from NSGY standpoint once medically ready, follow-up 2-3 weeks post-op with Dr. Jacklynn Bue, PA-C 10/26/23 9:17 AM

## 2023-10-26 NOTE — Progress Notes (Addendum)
Occupational Therapy Treatment Patient Details Name: Charles Barrera MRN: [id number] DOB: 06-18-1981 Today's Date: 10/26/2023   History of present illness 42 y.o. male admitted 10/30 with low back pain and weakness. 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 Underwent T4-T6 lami with mass resection 11/1. No significant PMHx:   OT comments  Pt seen s/p T4-6 lami and mass resection on 11/1. Educated pt on back precautions and began education on compensatory strategies for ADLs/functional mobility. Pt needing mod A for LB ADLs, pivots EOB to prop knee up as pt unable to perform figure 4. CGA for bed mobility and ADLs, min cues for back precautions. Pt and spouse provided with back precautions handout and briefly reviewed but will benefit from continued education/reinforcement. Pt presenting with impairments listed below, will follow acutely. Continue to anticipate no OT follow up needs at d/c.       If plan is discharge home, recommend the following:  Assistance with cooking/housework;Assist for transportation   Equipment Recommendations  BSC/3in1    Recommendations for Other Services PT consult    Precautions / Restrictions Precautions Precautions: Fall;Back Precaution Booklet Issued: Yes (comment) Precaution Comments: Reviewed Required Braces or Orthoses: Other Brace Other Brace: no brace needed per orders Restrictions Weight Bearing Restrictions: No       Mobility Bed Mobility Overal bed mobility: Needs Assistance Bed Mobility: Sidelying to Sit   Sidelying to sit: Contact guard assist       General bed mobility comments: incr time and use of bed rails, cues for log roll technique    Transfers Overall transfer level: Needs assistance Equipment used: Rolling walker (2 wheels) Transfers: Sit to/from Stand Sit  to Stand: Supervision                 Balance Overall balance assessment: Needs assistance Sitting-balance support: No upper extremity supported, Feet supported Sitting balance-Leahy Scale: Good     Standing balance support: No upper extremity supported Standing balance-Leahy Scale: Fair                             ADL either performed or assessed with clinical judgement   ADL Overall ADL's : Needs assistance/impaired                     Lower Body Dressing: Moderate assistance;Sitting/lateral leans Lower Body Dressing Details (indicate cue type and reason): propping knees up on bed via pivoting EOB Toilet Transfer: Contact guard assist;Ambulation;Regular Toilet;Rolling walker (2 wheels) Toilet Transfer Details (indicate cue type and reason): simuolated via functional mobility         Functional mobility during ADLs: Contact guard assist;Rolling walker (2 wheels)      Extremity/Trunk Assessment Upper Extremity Assessment Upper Extremity Assessment: Overall WFL for tasks assessed   Lower Extremity Assessment Lower Extremity Assessment: Defer to PT evaluation   Cervical / Trunk Assessment Cervical / Trunk Assessment: Back Surgery    Vision Baseline Vision/History: 0 No visual deficits Vision Assessment?: No apparent visual deficits   Perception Perception Perception: Within Functional Limits   Praxis Praxis Praxis: WFL    Cognition Arousal: Alert Behavior During Therapy: WFL for tasks assessed/performed Overall Cognitive Status: Within Functional Limits for tasks assessed  Exercises      Shoulder Instructions       General Comments      Pertinent Vitals/ Pain       Pain Assessment Pain Assessment: Faces Pain Location: Back Pain Descriptors / Indicators: Aching  Home Living Family/patient expects to be discharged to:: Private residence Living Arrangements:  Spouse/significant other Available Help at Discharge: Family;Available 24 hours/day Type of Home: House Home Access: Stairs to enter [employer] of Steps: 3-4 Entrance Stairs-Rails: Right;Left Home Layout: Two level;Able to live on main level with bedroom/bathroom     Bathroom Shower/Tub: Chief Strategy Officer: Standard Bathroom Accessibility: No   Home Equipment: Cane - single point          Prior Functioning/Environment              Frequency  Min 1X/week        Progress Toward Goals  OT Goals(current goals can now be found in the care plan section)     Acute Rehab OT Goals Patient Stated Goal: none stated OT Goal Formulation: With patient Time For Goal Achievement: 11/08/23 Potential to Achieve Goals: Good  Plan      Co-evaluation                 AM-PAC OT "6 Clicks" Daily Activity     Outcome Measure   Help from another person eating meals?: None Help from another person taking care of personal grooming?: A Little Help from another person toileting, which includes using toliet, bedpan, or urinal?: A Little Help from another person bathing (including washing, rinsing, drying)?: A Lot Help from another person to put on and taking off regular upper body clothing?: A Little Help from another person to put on and taking off regular lower body clothing?: A Lot 6 Click Score: 17    End of Session Equipment Utilized During Treatment: Rolling walker (2 wheels)  OT Visit Diagnosis: Unsteadiness on feet (R26.81)   Activity Tolerance Patient tolerated treatment well   Patient Left in chair;with call bell/phone within reach;with family/visitor present   Nurse Communication Mobility status        Time: 0981-1914 OT Time Calculation (min): 26 min  Charges: OT General Charges $OT Visit: 1 Visit OT Treatments $Self Care/Home Management : 23-37 mins  Carver Fila, OTD, OTR/L SecureChat Preferred Acute Rehab [phone]   Mckinsey Keagle K Koonce 10/26/2023, 11:14 AM

## 2023-10-26 NOTE — Progress Notes (Signed)
  Progress Note   Patient: Charles Barrera [id number]:[id number] DOB: [birth year] DOA: 10/23/2023     3 DOS: the patient was seen and examined on 10/26/2023   Brief hospital course: 42 y.o. male who denies any significant past medical history and presents for evaluation of back pain and weakness.   Patient noted development of atraumatic mid back pain in February 2024.  This has progressively worsened and he began developing bilateral lower extremity numbness and weakness in September.  He also has some paresthesias involving his abdominal wall.   [facility] ED Course: Upon arrival to the ED, patient is found to be afebrile and saturating well on room air with normal heart rate and stable blood pressure.  BMP and CBC are unremarkable.  MRI of the thoracic and lumbar spine is concerning for metastatic disease involving nearly all vertebral bodies, sacrum, and iliac bones with extraosseous mass extending from T4-T6 and displacing the spinal cord  Assessment and Plan: 1. Metastatic disease; spinal cord compression - Presents with progressive back pain and b/l LE weakness and has MRI findings concerning for metastatic disease leading to spinal cord compression  - Appreciate input by Oncology and Neurosurgery - Recs for decadron 6mg  q6h was recommended. 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 Osseus lesions re-demonstrated. Of note is a focal narrowing at the rectosigmoid junction, possibly mass, stricture, or muscular hypertrophy. Appreciate input by GI. Less likely he has colonic pathology given frozen section finding. However, pt is planned for flex sig 11/3 to exclude any abnormalities or additional problems   Subjective: Feeling stronger after surgery yesterday. Currently working with PT  Physical Exam: Vitals:   10/25/23 2029  10/26/23 0012 10/26/23 0335 10/26/23 1312  BP: (!) 135/91 (!) 101/54 117/69 121/84  Pulse: 75 81 71 67  Resp: 17 16 16 16   Temp: 97.9 F (36.6 C) 98.6 F (37 C) 98.5 F (36.9 C) 98.1 F (36.7 C)  TempSrc: Oral Oral Oral Oral  SpO2: 99% 96% 98% 96%  Weight:      Height:       General exam: Awake, standing up with PT Respiratory system: Normal respiratory effort, no wheezing Cardiovascular system: perfused, no notable JVD Gastrointestinal system: Soft, nondistended Central nervous system: CN2-12 grossly intact, no tremors Extremities: Perfused, no clubbing Skin: Normal skin turgor, no notable skin lesions seen Psychiatry: Mood normal // no visual hallucinations   Data Reviewed:  Labs reviewed: Na 137, K 4.3, Cr 0.97, WBC 16.0, Hgb 12.2, Plts 212  Family Communication: Pt in room, pt's wife at bedside  Disposition: Status is: Inpatient Remains inpatient appropriate because: severity of illness  Planned Discharge Destination:  Unclear at this time     Author: Rickey Barbara, MD 10/26/2023 3:48 PM  For on call review [email or web address].

## 2023-10-26 NOTE — Plan of Care (Signed)

## 2023-10-26 NOTE — Progress Notes (Signed)
Patient arrived alert and oriented from PACU. Patient in NAD. Wife at bedside.

## 2023-10-27 ENCOUNTER — Inpatient Hospital Stay (HOSPITAL_COMMUNITY): Payer: Self-pay | Admitting: Anesthesiology

## 2023-10-27 ENCOUNTER — Encounter (HOSPITAL_COMMUNITY): Payer: Self-pay | Admitting: Neurosurgery

## 2023-10-27 ENCOUNTER — Encounter (HOSPITAL_COMMUNITY)
Admission: EM | Disposition: A | Discharge status: Home / Self Care | Payer: Self-pay | Source: Home / Self Care | Attending: Internal Medicine

## 2023-10-27 DIAGNOSIS — G9520 Unspecified cord compression: Secondary | ICD-10-CM | POA: Diagnosis not present

## 2023-10-27 HISTORY — PX: FLEXIBLE SIGMOIDOSCOPY: SHX5431

## 2023-10-27 LAB — CEA: CEA: 2.8 ng/mL (ref 0.0–4.7)

## 2023-10-27 LAB — CBC
HCT: 40.2 % (ref 39.0–52.0)
Hemoglobin: 13.1 g/dL (ref 13.0–17.0)
MCH: 28.2 pg (ref 26.0–34.0)
MCHC: 32.6 g/dL (ref 30.0–36.0)
MCV: 86.5 fL (ref 80.0–100.0)
Platelets: 198 10*3/uL (ref 150–400)
RBC: 4.65 MIL/uL (ref 4.22–5.81)
RDW: 13.1 % (ref 11.5–15.5)
WBC: 13.1 10*3/uL — ABNORMAL HIGH (ref 4.0–10.5)
nRBC: 0 % (ref 0.0–0.2)

## 2023-10-27 LAB — CREATININE, SERUM
Creatinine, Ser: 0.96 mg/dL (ref 0.61–1.24)
GFR, Estimated: >60 mL/min (ref >60)

## 2023-10-27 LAB — BETA 2 MICROGLOBULIN, SERUM: Beta-2 Microglobulin: 1.1 mg/L (ref 0.6–2.4)

## 2023-10-27 LAB — BETA HCG QUANT (REF LAB): hCG Quant: <1 m[IU]/mL (ref 0–3)

## 2023-10-27 LAB — AFP TUMOR MARKER: AFP, Serum, Tumor Marker: 3.4 ng/mL (ref 0.0–6.9)

## 2023-10-27 SURGERY — SIGMOIDOSCOPY, FLEXIBLE
Anesthesia: Monitor Anesthesia Care

## 2023-10-27 MED ORDER — ENOXAPARIN SODIUM 60 MG/0.6ML IJ SOSY
0.5000 mg/kg | PREFILLED_SYRINGE | INTRAMUSCULAR | Status: DC
Start: 2023-10-27 — End: 2023-10-27

## 2023-10-27 MED ORDER — SODIUM CHLORIDE 0.9 % IV SOLN: INTRAVENOUS | Status: DC | PRN

## 2023-10-27 MED ORDER — ENOXAPARIN SODIUM 40 MG/0.4ML IJ SOSY: 40.0000 mg | PREFILLED_SYRINGE | INTRAMUSCULAR | Status: DC

## 2023-10-27 MED ORDER — PROPOFOL 500 MG/50ML IV EMUL
INTRAVENOUS | Status: DC | PRN
  Administered 2023-10-27: 180 ug/kg/min via INTRAVENOUS

## 2023-10-27 MED ORDER — PROPOFOL 10 MG/ML IV BOLUS
INTRAVENOUS | Status: DC | PRN
  Administered 2023-10-27: 20 mg via INTRAVENOUS

## 2023-10-27 MED ORDER — LIDOCAINE 2% (20 MG/ML) 5 ML SYRINGE
INTRAMUSCULAR | Status: DC | PRN
  Administered 2023-10-27: 40 mg via INTRAVENOUS

## 2023-10-27 MED FILL — Thrombin For Soln 5000 Unit: CUTANEOUS | Qty: 5000 | Status: AC

## 2023-10-27 NOTE — Transfer of Care (Signed)
Immediate Anesthesia Transfer of Care Note  Patient: Mikle Sternberg  Procedure(s) Performed: FLEXIBLE SIGMOIDOSCOPY  Patient Location: PACU  Anesthesia Type:MAC  Level of Consciousness: awake, alert , and oriented  Airway & Oxygen Therapy: Patient Spontanous Breathing  Post-op Assessment: Report given to RN and Post -op Vital signs reviewed and stable  Post vital signs: Reviewed and stable  Last Vitals:  Vitals Value Taken Time  BP    Temp    Pulse 64 10/27/23 0809  Resp 17 10/27/23 0809  SpO2 98 % 10/27/23 0809  Vitals shown include unfiled device data.  Last Pain:  Vitals:   10/27/23 0737  TempSrc: Temporal  PainSc: 3          Complications: No notable events documented.

## 2023-10-27 NOTE — Progress Notes (Signed)
  Progress Note   Patient: Charles Barrera [id number]:[id number] DOB: Jun 24, 1981 DOA: 10/23/2023     4 DOS: the patient was seen and examined on 10/27/2023   Brief hospital course: 42 y.o. male who denies any significant past medical history and presents for evaluation of back pain and weakness.   Patient noted development of atraumatic mid back pain in February 2024.  This has progressively worsened and he began developing bilateral lower extremity numbness and weakness in September.  He also has some paresthesias involving his abdominal wall.   [facility] ED Course: Upon arrival to the ED, patient is found to be afebrile and saturating well on room air with normal heart rate and stable blood pressure.  BMP and CBC are unremarkable.  MRI of the thoracic and lumbar spine is concerning for metastatic disease involving nearly all vertebral bodies, sacrum, and iliac bones with extraosseous mass extending from T4-T6 and displacing the spinal cord  Assessment and Plan: 1. Metastatic disease; spinal cord compression - Presents with progressive back pain and b/l LE weakness and has MRI findings concerning for metastatic disease leading to spinal cord compression  - Appreciate input by Oncology and Neurosurgery - Recs for decadron 6mg  q6h was recommended. 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 Osseus lesions re-demonstrated. Of note is a focal narrowing at the rectosigmoid junction. Pt is now s/p flex sig with normal findings -PSA neg. Myeloma panel pending   Subjective: Feeling stronger after surgery yesterday. Currently working with PT  Physical Exam: Vitals:   10/27/23 0823 10/27/23 0856 10/27/23 1145 10/27/23 1557  BP: 113/84 (!) 143/82 115/73 111/73  Pulse: 60 (!) 55 86 68  Resp: 13 14 14 18   Temp: 97.9 F (36.6 C) 98.2 F  (36.8 C) 98.1 F (36.7 C) 98.3 F (36.8 C)  TempSrc:  Oral Oral Oral  SpO2: 98% 100% 98% 98%  Weight:      Height:       General exam: Conversant, in no acute distress Respiratory system: normal chest rise, clear, no audible wheezing Cardiovascular system: regular rhythm, s1-s2 Gastrointestinal system: Nondistended, nontender, pos BS Central nervous system: No seizures, no tremors Extremities: No cyanosis, no joint deformities Skin: No rashes, no pallor Psychiatry: Affect normal // no auditory hallucinations   Data Reviewed:  Labs reviewed: WBC 13.1, Hgb 13.1  Family Communication: Pt in room, pt's wife at bedside  Disposition: Status is: Inpatient Remains inpatient appropriate because: severity of illness  Planned Discharge Destination: Home     Author: Rickey Barbara, MD 10/27/2023 5:23 PM  For on call review [email or web address].

## 2023-10-27 NOTE — Plan of Care (Signed)

## 2023-10-27 NOTE — Interval H&P Note (Signed)
History and Physical Interval Note:  10/27/2023 7:58 AM  Charles Barrera  has presented today for surgery, with the diagnosis of Abnormal CT scan.  The various methods of treatment have been discussed with the patient and family. After consideration of risks, benefits and other options for treatment, the patient has consented to  Procedure(s): FLEXIBLE SIGMOIDOSCOPY (N/A) as a surgical intervention.  The patient's history has been reviewed, patient examined, no change in status, stable for surgery.  I have reviewed the patient's chart and labs.  Questions were answered to the patient's satisfaction.     Dawnn Nam D

## 2023-10-27 NOTE — Progress Notes (Signed)
Neurosurgery Service Progress Note  Subjective: No acute events overnight. Pt was in endo for FFS during rounds this morning. Per nursing staff pt fell off of the bedside commode last night at shift change.   Objective: Vitals:   10/26/23 2042 10/26/23 2329 10/27/23 0334 10/27/23 0737  BP: 124/76 129/79 114/82 120/85  Pulse: 60 71 (!) 59 61  Resp: 16 16 14 14   Temp: 98.3 F (36.8 C) 98 F (36.7 C) 98.3 F (36.8 C) 98.4 F (36.9 C)  TempSrc: Oral Oral Oral Temporal  SpO2: 100% 98% 98% 96%  Weight:    120.2 kg  Height:    6\' 2"  (1.88 m)    Physical Exam: Pt in endo for procedure  Assessment & Plan: 42 y.o. male s/p T4-6 laminectomy for resection of epidural mass.   -PT/OT  -OK for DVT chemoppx today if needed   -continue medical management per attending service  -stable for discharge from NSGY standpoint once medically ready, follow-up 2-3 weeks post-op with Dr. Jacklynn Bue, PA-C 10/27/23 7:43 AM

## 2023-10-27 NOTE — Progress Notes (Addendum)
Miscommunication of room number to neurosurgery team; this patient did not have a fall last evening; wrong patient. MD will be notified.

## 2023-10-27 NOTE — Anesthesia Postprocedure Evaluation (Signed)
Anesthesia Post Note  Patient: Charles Barrera  Procedure(s) Performed: FLEXIBLE SIGMOIDOSCOPY     Patient location during evaluation: PACU Anesthesia Type: MAC Level of consciousness: awake and alert Pain management: pain level controlled Vital Signs Assessment: post-procedure vital signs reviewed and stable Respiratory status: spontaneous breathing, nonlabored ventilation and respiratory function stable Cardiovascular status: blood pressure returned to baseline and stable Postop Assessment: no apparent nausea or vomiting Anesthetic complications: no   No notable events documented.  Last Vitals:  Vitals:   10/27/23 0808 10/27/23 0815  BP: 113/69 116/80  Pulse: 64 (!) 59  Resp: 17 14  Temp: 36.6 C   SpO2: 98% 97%    Last Pain:  Vitals:   10/27/23 0808  TempSrc:   PainSc: 0-No pain                 Lannie Fields

## 2023-10-27 NOTE — Op Note (Signed)
[facility] Patient Name: Charles Barrera Procedure Date : 10/27/2023 MRN: [id number] Attending MD: Jeani Hawking , MD, [id number] Date of Birth: [birth year] CSN: [id number] Age: 42 Admit Type: Inpatient Procedure:                Flexible Sigmoidoscopy Indications:              Abnormal CT of the GI tract Providers:                Jeani Hawking, MD, Eliberto Ivory, RN, Marja Kays,                            Technician Referring MD:              Medicines:                Propofol per Anesthesia Complications:            No immediate complications. Estimated Blood Loss:     Estimated blood loss: none. Procedure:                Pre-Anesthesia Assessment:                           - Prior to the procedure, a History and Physical                            was performed, and patient medications and                            allergies were reviewed. The patient's tolerance of                            previous anesthesia was also reviewed. The risks                            and benefits of the procedure and the sedation                            options and risks were discussed with the patient.                            All questions were answered, and informed consent                            was obtained. Prior Anticoagulants: The patient has                            taken no anticoagulant or antiplatelet agents. ASA                            Grade Assessment: III - A patient with severe                            systemic disease. After reviewing the risks and  benefits, the patient was deemed in satisfactory                            condition to undergo the procedure.                           - Sedation was administered by an anesthesia                            professional. Deep sedation was attained.                           After obtaining informed consent, the scope was                            passed under direct vision. The  [id number]-[id number] ([id number])                            Olympus endoscope was introduced through the anus                            and advanced to the the descending colon. The                            flexible sigmoidoscopy was accomplished without                            difficulty. The patient tolerated the procedure                            well. The quality of the bowel preparation was good. Scope In: 7:52:05 AM Scope Out: 7:54:48 AM Total Procedure Duration: 0 hours 2 minutes 43 seconds  Findings:      The rectum, recto-sigmoid colon and sigmoid colon appeared normal. Impression:               - The rectum, sigmoid colon and recto-sigmoid colon                            are normal.                           - No specimens collected. Recommendation:           - Return patient to hospital ward for ongoing care.                           - Resume regular diet.                           - No further GI evaluation. Signing off. Procedure Code(s):        --- Professional ---                           [phone], Sigmoidoscopy, flexible; diagnostic,  including collection of specimen(s) by brushing or                            washing, when performed (separate procedure) Diagnosis Code(s):        --- Professional ---                           R93.3, Abnormal findings on diagnostic imaging of                            other parts of digestive tract CPT copyright 2022 [employer]. All rights reserved. The codes documented in this report are preliminary and upon coder review may  be revised to meet current compliance requirements. Jeani Hawking, MD Jeani Hawking, MD 10/27/2023 8:02:00 AM This report has been signed electronically. Number of Addenda: 0

## 2023-10-28 ENCOUNTER — Other Ambulatory Visit (HOSPITAL_COMMUNITY): Payer: Self-pay

## 2023-10-28 DIAGNOSIS — G9520 Unspecified cord compression: Secondary | ICD-10-CM | POA: Diagnosis not present

## 2023-10-28 LAB — KAPPA/LAMBDA LIGHT CHAINS
Kappa free light chain: 13.4 mg/L (ref 3.3–19.4)
Kappa, lambda light chain ratio: 0.96 (ref 0.26–1.65)
Lambda free light chains: 13.9 mg/L (ref 5.7–26.3)

## 2023-10-28 MED ORDER — OXYCODONE HCL 5 MG PO TABS
5.0000 mg | ORAL_TABLET | ORAL | 0 refills | Status: DC | PRN
  Filled 2023-10-28: qty 20, 4d supply, fill #0

## 2023-10-28 MED ORDER — DEXAMETHASONE 2 MG PO TABS
ORAL_TABLET | ORAL | 0 refills | Status: AC
  Filled 2023-10-28: qty 48, 6d supply, fill #0

## 2023-10-28 NOTE — Progress Notes (Signed)
    Durable Medical Equipment  (From admission, onward)           Start     Ordered   10/28/23 1009  For home use only DME Bedside commode  Once       Question:  Patient needs a bedside commode to treat with the following condition  Answer:  Weakness   10/28/23 1008   10/28/23 1009  For home use only DME Walker rolling  Once       Question Answer Comment  Walker: With 5 Inch Wheels   Patient needs a walker to treat with the following condition Weakness      10/28/23 1008            The patient is confined to one level of the home environment and there is no toilet on the that level of the home.

## 2023-10-28 NOTE — TOC Transition Note (Signed)
Transition of Care [facility]) - CM/SW Discharge Note   Patient Details  Name: Charles Barrera MRN: [id number] Date of Birth: 02/11/1981  [facility] CM/SW Contact:  Kermit Balo, RN Phone Number: 10/28/2023, 3:56 PM   Clinical Narrative:     Pt is discharging home with self care.  Walker/ bsc delivered to the room per [employer]. Pt has supervision at home and transportation to home.   Final next level of care: Home/Self Care Barriers to Discharge: No Barriers Identified   Patient Goals and CMS Choice      Discharge Placement                         Discharge Plan and Services Additional resources added to the After Visit Summary for                  DME Arranged: Bedside commode, Walker rolling DME Agency: [employer] Date DME Agency Contacted: 10/28/23   Representative spoke with at DME Agency: Timothy Lasso            Social Determinants of Health (SDOH) Interventions SDOH Screenings   Food Insecurity: No Food Insecurity (10/23/2023)  Housing: Low Risk  (10/23/2023)  Transportation Needs: No Transportation Needs (10/23/2023)  Utilities: Not At Risk (10/23/2023)  Social Connections: Unknown (05/08/2022)   Received from [facility], [facility]  Tobacco Use: Low Risk  (10/27/2023)     Readmission Risk Interventions     No data to display

## 2023-10-28 NOTE — Discharge Summary (Signed)
Physician Discharge Summary   Patient: Charles Barrera MRN: [id number] DOB: October 29, 1981  Admit date:     10/23/2023  Discharge date: 10/28/23  Discharge Physician: Rickey Barbara   PCP: Caffie Damme, MD   Recommendations at discharge:    Follow up with PCP in 1-2 weeks Follow up with Oncology as scheduled Follow up with Neurosurgery as scheduled  Discharge Diagnoses: Principal Problem:   Spinal cord compression [facility]) Active Problems:   Malignant neoplasm metastatic to bone [facility])  Resolved Problems:   * No resolved hospital problems. *  Hospital Course: 42 y.o. male who denies any significant past medical history and presents for evaluation of back pain and weakness.   Patient noted development of atraumatic mid back pain in February 2024.  This has progressively worsened and he began developing bilateral lower extremity numbness and weakness in September.  He also has some paresthesias involving his abdominal wall.   [facility] ED Course: Upon arrival to the ED, patient is found to be afebrile and saturating well on room air with normal heart rate and stable blood pressure.  BMP and CBC are unremarkable.  MRI of the thoracic and lumbar spine is concerning for metastatic disease involving nearly all vertebral bodies, sacrum, and iliac bones with extraosseous mass extending from T4-T6 and displacing the spinal cord  Assessment and Plan: 1. Metastatic disease; spinal cord compression - Presents with progressive back pain and b/l LE weakness and has MRI findings concerning for metastatic disease leading to spinal cord compression  - Appreciate input by Oncology and Neurosurgery - Recs for decadron 6mg  q6h was recommended. 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 Osseus lesions re-demonstrated. Of  note is a focal narrowing at the rectosigmoid junction. Pt is now s/p flex sig with normal findings -PSA neg. Myeloma panel pending -discussed with Oncology, for close outpatient follow up -Discussed with Neurosurgery. Pt to follow up as scheduled. Neurosurgery recommends steroid taper as outpatient    Consultants: Neurosurgery, Oncology Procedures performed: T4-6 laminectomy and debulking of epidural mass  Disposition: Home Diet recommendation:  Regular diet DISCHARGE MEDICATION: Allergies as of 10/28/2023   No Known Allergies      Medication List     TAKE these medications    dexamethasone 2 MG tablet Commonly known as: DECADRON Take 3 tablets (6 mg total) by mouth every 6 (six) hours for 2 days, THEN 2 tablets (4 mg total) every 6 (six) hours for 2 days, THEN 1 tablet (2 mg total) every 6 (six) hours for 2 days. Start taking on: October 28, 2023   oxyCODONE 5 MG immediate release tablet Commonly known as: Oxy IR/ROXICODONE Take 1 tablet (5 mg total) by mouth every 4 (four) hours as needed for moderate pain (pain score 4-6).   pregabalin 150 MG capsule Commonly known as: LYRICA Take 150 mg by mouth 2 (two) times daily.               Durable Medical Equipment  (From admission, onward)           Start     Ordered   10/28/23 1009  For home use only DME Bedside commode  Once       Question:  Patient needs a bedside commode to treat with the following condition  Answer:  Weakness   10/28/23 1008   10/28/23 1009  For home use only DME Walker rolling  Once       Question Answer Comment  Walker: With 5 Inch Wheels   Patient needs a walker to treat with the following condition Weakness      10/28/23 1008            Follow-up Information     Caffie Damme, MD Follow up in 2 week(s).   Specialty: Family Medicine Why: Hospital follow up Contact information: [address] [place] [address] [phone]         Bedelia Person, MD Follow up.    Specialty: Neurosurgery Why: Hospital follow up, as scheduled Contact information: [address] [place] [address] [phone]         Meryl Crutch, MD Follow up.   Specialty: Oncology Why: Hospital follow up, as scheduled Contact information: [address] [place] [address] [phone]                Discharge Exam: Ceasar Mons Weights   10/23/23 1409 10/25/23 1339 10/27/23 0737  Weight: 120.2 kg 120.2 kg 120.2 kg   General exam: Awake, laying in bed, in nad Respiratory system: Normal respiratory effort, no wheezing Cardiovascular system: regular rate, s1, s2 Gastrointestinal system: Soft, nondistended, positive BS Central nervous system: CN2-12 grossly intact, strength intact Extremities: Perfused, no clubbing Skin: Normal skin turgor, no notable skin lesions seen Psychiatry: Mood normal // no visual hallucinations   Condition at discharge: fair  The results of significant diagnostics from this hospitalization (including imaging, microbiology, ancillary and laboratory) are listed below for reference.   Imaging Studies: DG Thoracic Spine 2 View  Result Date: 10/25/2023 CLINICAL DATA:  Thoracic spine surgery. EXAM: THORACIC SPINE 2 VIEWS COMPARISON:  None Available. FINDINGS: Single fluoroscopic spot images provided. The total fluoroscopic time is 56.8 second with cumulative air Karma of 25 mGy. Orthopedic hardware noted. IMPRESSION: Fluoroscopic spot images during thoracic spine surgery. Electronically Signed   By: Elgie Collard M.D.   On: 10/25/2023 22:52   DG C-Arm 1-60 Min-No Report  Result Date: 10/25/2023 Fluoroscopy was utilized by the requesting physician.  No radiographic interpretation.   DG C-Arm 1-60 Min-No Report  Result Date: 10/25/2023 Fluoroscopy was utilized by the requesting physician.  No radiographic interpretation.   DG C-Arm 1-60 Min-No Report  Result Date: 10/25/2023 Fluoroscopy was utilized by the  requesting physician.  No radiographic interpretation.   CT CHEST ABDOMEN PELVIS W CONTRAST  Result Date: 10/25/2023 CLINICAL DATA:  Musculoskeletal neoplasm for staging. EXAM: CT CHEST, ABDOMEN, AND PELVIS WITH CONTRAST TECHNIQUE: Multidetector CT imaging of the chest, abdomen and pelvis was performed following the standard protocol during bolus administration of intravenous contrast. RADIATION DOSE REDUCTION: This exam was performed according to the departmental dose-optimization program which includes automated exposure control, adjustment of the mA and/or kV according to patient size and/or use of iterative reconstruction technique. CONTRAST:  75mL OMNIPAQUE IOHEXOL 350 MG/ML SOLN COMPARISON:  Chest radiograph 07/19/2018. MRI thoracic and lumbar spine 10/23/2023 FINDINGS: CT CHEST FINDINGS Cardiovascular: No significant vascular findings. Normal heart size. No pericardial effusion. Mediastinum/Nodes: No enlarged mediastinal, hilar, or axillary lymph nodes. Thyroid gland, trachea, and esophagus demonstrate no significant findings. Lungs/Pleura: Lungs are clear. No pleural effusion or pneumothorax. Musculoskeletal: Heterogeneous sclerosis throughout the thoracic vertebrae and sternum consistent with diffuse metastatic disease or myeloma. This is better demonstrated on prior MRI. See previous report. Nodular soft tissue process along the left posterior paraspinal space extending from T4 through  T8 consistent with primary or metastatic disease. See also report of previous thoracic spine MRI. CT ABDOMEN PELVIS FINDINGS Hepatobiliary: No focal liver abnormality is seen. No gallstones, gallbladder wall thickening, or biliary dilatation. Pancreas: Unremarkable. No pancreatic ductal dilatation or surrounding inflammatory changes. Spleen: Normal in size without focal abnormality. Adrenals/Urinary Tract: Adrenal glands are unremarkable. Kidneys are normal, without renal calculi, focal lesion, or hydronephrosis. Bladder  is unremarkable. Stomach/Bowel: Stomach, small bowel, and colon are not abnormally distended. Stool diffusely fills the colon. There is a focal area of narrowing and shouldering of the colon at the rectosigmoid junction. Proximal and distal colon/rectum at this level are diffusely stool-filled. This could represent a focal neoplasm, stricture, or muscular hypertrophy. Appendix is normal. Vascular/Lymphatic: Minimal aortic calcification. No aneurysm. No significant lymphadenopathy. Reproductive: Prostate is unremarkable. Other: No abdominal wall hernia or abnormality. No abdominopelvic ascites. Musculoskeletal: Heterogeneous sclerosis throughout the lumbar vertebrae, sacrum, and pelvis consistent with diffuse metastatic disease or myeloma. See also report of prior lumbar spine MRI. IMPRESSION: 1. Diffuse heterogeneous bone sclerosis likely indicating metastatic disease or myeloma. See additional report of MRI thoracic and lumbar spine. 2. Left paraspinal mass extending from T4 through T8 level. 3. Focal narrowing of the colon at the rectosigmoid junction, possibly mass, stricture, or muscular hypertrophy. Consider endoscopy if clinically indicated. Electronically Signed   By: Burman Nieves M.D.   On: 10/25/2023 16:18   MR Lumbar Spine W Wo Contrast  Result Date: 10/23/2023 CLINICAL DATA:  Myelopathy.  Weakness of the lower extremities. EXAM: MRI LUMBAR SPINE WITHOUT AND WITH CONTRAST TECHNIQUE: Multiplanar and multiecho pulse sequences of the lumbar spine were obtained without and with intravenous contrast. CONTRAST:  10mL GADAVIST GADOBUTROL 1 MMOL/ML IV SOLN COMPARISON:  None Available. FINDINGS: Segmentation:  5 lumbar type vertebral bodies. Alignment:  Normal Vertebrae: Metastatic disease affecting all the vertebral bodies throughout the region as well as the sacrum in the iliac bones. No evidence of regional pathologic fracture. No extraosseous tumor in this region. Conus medullaris and cauda equina:  Conus extends to the T12 level. Conus and cauda equina appear normal. Paraspinal and other soft tissues: No abnormality seen. Disc levels: Mild non-compressive disc bulges at L4-5 and L5-S1. IMPRESSION: 1. Metastatic disease affecting all the vertebral bodies throughout the region as well as the sacrum and iliac bones. No evidence of regional pathologic fracture. No extraosseous tumor in this region. 2. Mild non-compressive disc bulges at L4-5 and L5-S1. 3. Critical Value/emergent results were called by telephone at the time of interpretation on 10/23/2023 at 4:51 pm to provider [facility] , who verbally acknowledged these results. Electronically Signed   By: Paulina Fusi M.D.   On: 10/23/2023 16:53   MR THORACIC SPINE W WO CONTRAST  Result Date: 10/23/2023 CLINICAL DATA:  Myelopathy, acute. Bilateral lower extremity weakness. Balance disturbance. Symptoms began over the last few months. EXAM: MRI THORACIC WITHOUT AND WITH CONTRAST TECHNIQUE: Multiplanar and multiecho pulse sequences of the thoracic spine were obtained without and with intravenous contrast. CONTRAST:  10mL GADAVIST GADOBUTROL 1 MMOL/ML IV SOLN COMPARISON:  None Available. FINDINGS: Alignment:  No malalignment. Vertebrae: There is widespread metastatic disease or myeloma throughout the spine. Virtually every level is involved. There is no evidence of regional pathologic fracture. There is an intraspinal mass which appears to be extradural extending from mid T4 to mid T6 in the posterior right side of the spinal canal. This displaces the spinal cord and could be compressing the cord. No other extraosseous tumor seen in  the region. Paraspinal and other soft tissues: No para spinous lesion identified. Disc levels: No evidence of disc level pathology. No degenerative disease or herniation. IMPRESSION: 1. 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 Virtually every level is involved. No evidence of regional pathologic fracture.  2. Extraosseous mass in the posterior right side of the spinal canal extending from mid T4 to mid T6. This displaces the spinal cord and could be compressing the cord. 3. Call report in progress. Electronically Signed   By: Paulina Fusi M.D.   On: 10/23/2023 16:48    Microbiology: Results for orders placed or performed during the hospital encounter of 10/23/23  Surgical pcr screen     Status: None   Collection Time: 10/25/23  9:39 AM   Specimen: Nasal Mucosa; Nasal Swab  Result Value Ref Range Status   MRSA, PCR NEGATIVE NEGATIVE Final   Staphylococcus aureus NEGATIVE NEGATIVE Final    Comment: (NOTE) The Xpert SA Assay (FDA approved for NASAL specimens in patients 58 years of age and older), is one component of a comprehensive surveillance program. It is not intended to diagnose infection nor to guide or monitor treatment. Performed at [facility], [address], [place], [place] [address]     Labs: CBC: Recent Labs  Lab 10/23/23 1434 10/24/23 0728 10/25/23 0506 10/26/23 0525 10/27/23 1327  WBC 5.3 5.3 10.6* 16.0* 13.1*  NEUTROABS 2.9  --   --   --   --   HGB 14.0 14.2 14.9 12.2* 13.1  HCT 42.8 44.1 45.1 38.0* 40.2  MCV 87.2 87.0 86.1 86.2 86.5  PLT 203 202 235 212 198   Basic Metabolic Panel: Recent Labs  Lab 10/23/23 1434 10/24/23 0728 10/25/23 0506 10/26/23 0525 10/27/23 1327  NA 139 140 139 137  --   K 3.5 3.7 3.8 4.3  --   CL 101 105 107 105  --   CO2 29 25 24 24   --   GLUCOSE 91 108* 180* 140*  --   BUN 10 9 12 13   --   CREATININE 1.08 1.05 0.95 0.97 0.96  CALCIUM 9.1 9.1 9.3 8.6*  --    Liver Function Tests: No results for input(s): "AST", "ALT", "ALKPHOS", "BILITOT", "PROT", "ALBUMIN" in the last 168 hours. CBG: No results for input(s): "GLUCAP" in the last 168 hours.  Discharge time spent: less than 30 minutes.  Signed: Rickey Barbara, MD [facility] 10/28/2023

## 2023-10-28 NOTE — Progress Notes (Signed)
Charles Barrera to be D/C'd home per MD order. Discussed with the patient and all questions fully answered. TOC meds, walker, and BSC delivered to room.  Skin clean and dry without evidence of skin break down, no evidence of skin tears noted. Incision to back clean and intact. IV catheter discontinued intact. Site without signs and symptoms of complications. Dressing and pressure applied.  An After Visit Summary was printed and given to the patient.  Patient escorted via WC, and D/C home via private auto.  Charles Barrera  10/28/2023 5:42 PM

## 2023-10-28 NOTE — TOC Progression Note (Signed)
Transition of Care [facility]) - Progression Note    Patient Details  Name: Charles Barrera MRN: [id number] Date of Birth: 02/21/1981  [facility]) CM/SW Contact  Kermit Balo, RN Phone Number: 10/28/2023, 3:05 PM  Clinical Narrative:     Pt with recommendations for outpatient therapy. Pt will follow  up at MD office before outpatient starts.  Walker and BSC ordered through [employer] and will be delivered to the pts room. Pt has transport home when discharged.   Expected Discharge Plan: Home/Self Care Barriers to Discharge: Continued Medical Work up  Expected Discharge Plan and Services       Living arrangements for the past 2 months: Single Family Home                                       Social Determinants of Health (SDOH) Interventions SDOH Screenings   Food Insecurity: No Food Insecurity (10/23/2023)  Housing: Low Risk  (10/23/2023)  Transportation Needs: No Transportation Needs (10/23/2023)  Utilities: Not At Risk (10/23/2023)  Social Connections: Unknown (05/08/2022)   Received from [facility]  Tobacco Use: Low Risk  (10/27/2023)    Readmission Risk Interventions     No data to display

## 2023-10-28 NOTE — Progress Notes (Signed)
Physical Therapy Treatment Patient Details Name: Charles Barrera MRN: [id number] DOB: Nov 23, 1981 Today's Date: 10/28/2023   History of Present Illness 42 y.o. male admitted 10/30 with low back pain and weakness. 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 Underwent T4-T6 lami with mass resection 11/1. No significant PMHx:    PT Comments  Pt greeted up in chair and agreeable to session with continued progress towards acute goals. Pt continues to require grossly supervision during gait with RW support as pt demonstrating slow guarded gait without UE support needing CGA for safety. Pt able to ascend/descend steps with CGA for safety and cues for sequencing. Pt was educated on continued walker use to maximize functional independence, safety, and decrease risk for falls as well as safe car entry/exit and appropriate activity progression. Pt continues to benefit from skilled PT services to progress toward functional mobility goals.     If plan is discharge home, recommend the following: A little help with walking and/or transfers;A little help with bathing/dressing/bathroom;Assist for transportation   Can travel by private vehicle        Equipment Recommendations  Rolling walker (2 wheels)    Recommendations for Other Services       Precautions / Restrictions Precautions Precautions: Fall;Back Precaution Booklet Issued: Yes (comment) Precaution Comments: Reviewed Required Braces or Orthoses: Other Brace Other Brace: no brace needed per orders Restrictions Weight Bearing Restrictions: No     Mobility  Bed Mobility Overal bed mobility: Needs Assistance             General bed mobility comments: pt up in chair on arrival    Transfers Overall transfer level: Needs assistance Equipment used: Rolling walker (2 wheels), 1  person hand held assist Transfers: Sit to/from Stand Sit to Stand: Contact guard assist, Supervision           General transfer comment: CGA on initial rise from recliner, supervision from standard arm chair    Ambulation/Gait Ambulation/Gait assistance: Supervision, Contact guard assist Gait Distance (Feet): 200 Feet Assistive device: Rolling walker (2 wheels), None Gait Pattern/deviations: Step-through pattern, Decreased stride length, Wide base of support Gait velocity: decr     General Gait Details: With RW pt moves safely without evidence of LOB. Without RW pt very slow, cautious and guarded with each step with increased postural sway and heavy foot fall   Stairs Stairs: Yes Stairs assistance: Contact guard assist Stair Management: Two rails, Alternating pattern, Step to pattern, Forwards Number of Stairs: 10 General stair comments: pt able to ascend/descend steps in theraphy gym with CGA for safety, cues for sequencing as pt reporting R stronger than L   Wheelchair Mobility     Tilt Bed    Modified Rankin (Stroke Patients Only)       Balance Overall balance assessment: Needs assistance Sitting-balance support: No upper extremity supported, Feet supported Sitting balance-Leahy Scale: Good     Standing balance support: No upper extremity supported Standing balance-Leahy Scale: Fair Standing balance comment: short distance ambulation without RW, benefits from reaching out for external support                            Cognition Arousal: Alert Behavior During Therapy: [facility] for tasks assessed/performed Overall Cognitive Status: Within Functional Limits for tasks assessed  Exercises      General Comments General comments (skin integrity, edema, etc.): VSS on RA      Pertinent Vitals/Pain Pain Assessment Pain Assessment: No/denies pain Pain Intervention(s): Monitored during session     Home Living                          Prior Function            PT Goals (current goals can now be found in the care plan section) Acute Rehab PT Goals PT Goal Formulation: With patient Time For Goal Achievement: 11/08/23 Progress towards PT goals: Progressing toward goals    Frequency    Min 1X/week      PT Plan      Co-evaluation              AM-PAC PT "6 Clicks" Mobility   Outcome Measure  Help needed turning from your back to your side while in a flat bed without using bedrails?: None Help needed moving from lying on your back to sitting on the side of a flat bed without using bedrails?: None Help needed moving to and from a bed to a chair (including a wheelchair)?: None Help needed standing up from a chair using your arms (e.g., wheelchair or bedside chair)?: A Little Help needed to walk in hospital room?: A Little Help needed climbing 3-5 steps with a railing? : A Little 6 Click Score: 21    End of Session   Activity Tolerance: Patient tolerated treatment well Patient left: in chair;with call bell/phone within reach;Other (comment) (with lunch tray set up) Nurse Communication: Mobility status PT Visit Diagnosis: Unsteadiness on feet (R26.81);Other abnormalities of gait and mobility (R26.89);Ataxic gait (R26.0);Other symptoms and signs involving the nervous system (R29.898);Pain     Time: 1610-9604 PT Time Calculation (min) (ACUTE ONLY): 20 min  Charges:    $Gait Training: 8-22 mins PT General Charges $$ ACUTE PT VISIT: 1 Visit                     Camyla Camposano R. PTA Acute Rehabilitation Services Office: [phone]   Catalina Antigua 10/28/2023, 1:06 PM

## 2023-10-28 NOTE — Progress Notes (Signed)
Occupational Therapy Treatment Patient Details Name: Charles Barrera MRN: [id number] DOB: 11-14-1981 Today's Date: 10/28/2023   History of present illness 42 y.o. male admitted 10/30 with low back pain and weakness. 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 Underwent T4-T6 lami with mass resection 11/1. No significant PMHx:   OT comments  Pt progressing towards goals this session, with good recall of back precautions and log roll technique for bed mobility. Pt completing grooming tasks, toilet transfer and tub transfers with CGA-minA, cues for compensatory strategies/back prec. Educated pt on use of 3in1 as shower seat for home and how to adjust, pt verbalized understanding, reviewed back prec handout. Pt presenting with impairments listed below, will follow acutely. Anticipate no OT follow up needs at d/c.       If plan is discharge home, recommend the following:  Assistance with cooking/housework;Assist for transportation   Equipment Recommendations  BSC/3in1    Recommendations for Other Services PT consult    Precautions / Restrictions Precautions Precautions: Fall;Back Precaution Booklet Issued: Yes (comment) Precaution Comments: Reviewed Required Braces or Orthoses: Other Brace Other Brace: no brace needed per orders Restrictions Weight Bearing Restrictions: No       Mobility Bed Mobility Overal bed mobility: Needs Assistance Bed Mobility: Sidelying to Sit   Sidelying to sit: Contact guard assist            Transfers Overall transfer level: Needs assistance Equipment used: Rolling walker (2 wheels), 1 person hand held assist Transfers: Sit to/from Stand Sit to Stand: Contact guard assist                 Balance Overall balance assessment: Needs assistance Sitting-balance support: No upper  extremity supported, Feet supported Sitting balance-Leahy Scale: Good     Standing balance support: No upper extremity supported Standing balance-Leahy Scale: Fair Standing balance comment: short distance ambulation without RW, benefits from reaching out for external support                           ADL either performed or assessed with clinical judgement   ADL Overall ADL's : Needs assistance/impaired     Grooming: Set up;Oral care;Cueing for compensatory techniques Grooming Details (indicate cue type and reason): cues to spit in cup vs sink for back prec                 Toilet Transfer: Contact guard assist;Ambulation;Regular Toilet;Rolling walker (2 wheels) Toilet Transfer Details (indicate cue type and reason): simuolated via functional mobility     Tub/ Shower Transfer: Minimal assistance;Tub transfer Tub/Shower Transfer Details (indicate cue type and reason): simulated Functional mobility during ADLs: Contact guard assist      Extremity/Trunk Assessment Upper Extremity Assessment Upper Extremity Assessment: Overall WFL for tasks assessed   Lower Extremity Assessment Lower Extremity Assessment: Defer to PT evaluation        Vision   Vision Assessment?: No apparent visual deficits   Perception Perception Perception: Within Functional Limits   Praxis Praxis Praxis: WFL    Cognition Arousal: Alert Behavior During Therapy: WFL for tasks assessed/performed Overall Cognitive Status: Within Functional Limits for tasks assessed  Exercises      Shoulder Instructions       General Comments VSS    Pertinent Vitals/ Pain       Pain Assessment Pain Assessment: No/denies pain  Home Living                                          Prior Functioning/Environment              Frequency  Min 1X/week        Progress Toward Goals  OT Goals(current goals can  now be found in the care plan section)  Progress towards OT goals: Progressing toward goals  Acute Rehab OT Goals Patient Stated Goal: none stated OT Goal Formulation: With patient Time For Goal Achievement: 11/08/23 Potential to Achieve Goals: Good ADL Goals Additional ADL Goal #1: Pt will complete all ADLs with mod I Additional ADL Goal #2: Pt will indep recall spinal precuations and carry them out during functional activities  Plan      Co-evaluation                 AM-PAC OT "6 Clicks" Daily Activity     Outcome Measure   Help from another person eating meals?: None Help from another person taking care of personal grooming?: A Little Help from another person toileting, which includes using toliet, bedpan, or urinal?: A Little Help from another person bathing (including washing, rinsing, drying)?: A Lot Help from another person to put on and taking off regular upper body clothing?: A Little Help from another person to put on and taking off regular lower body clothing?: A Lot 6 Click Score: 17    End of Session Equipment Utilized During Treatment: Rolling walker (2 wheels)  OT Visit Diagnosis: Unsteadiness on feet (R26.81)   Activity Tolerance Patient tolerated treatment well   Patient Left in chair;with call bell/phone within reach   Nurse Communication Mobility status        Time: 1610-9604 OT Time Calculation (min): 24 min  Charges: OT General Charges $OT Visit: 1 Visit OT Treatments $Self Care/Home Management : 23-37 mins  Charles Barrera, OTD, OTR/L [employer] Preferred Acute Rehab [phone]   Charles Barrera 10/28/2023, 8:36 AM

## 2023-10-29 ENCOUNTER — Encounter: Payer: Self-pay | Admitting: Surgery

## 2023-10-29 LAB — MULTIPLE MYELOMA PANEL, SERUM
Albumin SerPl Elph-Mcnc: 4.3 g/dL (ref 2.9–4.4)
Albumin/Glob SerPl: 1.4 (ref 0.7–1.7)
Alpha 1: 0.2 g/dL (ref 0.0–0.4)
Alpha2 Glob SerPl Elph-Mcnc: 0.7 g/dL (ref 0.4–1.0)
B-Globulin SerPl Elph-Mcnc: 1.1 g/dL (ref 0.7–1.3)
Gamma Glob SerPl Elph-Mcnc: 1.2 g/dL (ref 0.4–1.8)
Globulin, Total: 3.2 g/dL (ref 2.2–3.9)
IgA: 154 mg/dL (ref 90–386)
IgG (Immunoglobin G), Serum: 1265 mg/dL (ref 603–1613)
IgM (Immunoglobulin M), Srm: 104 mg/dL (ref 20–172)
Total Protein ELP: 7.5 g/dL (ref 6.0–8.5)

## 2023-10-30 ENCOUNTER — Encounter (HOSPITAL_COMMUNITY): Payer: Self-pay | Admitting: Gastroenterology

## 2023-11-01 ENCOUNTER — Other Ambulatory Visit: Payer: Self-pay | Admitting: Radiation Therapy

## 2023-11-01 LAB — SURGICAL PATHOLOGY

## 2023-11-02 ENCOUNTER — Telehealth: Payer: Self-pay | Admitting: Oncology

## 2023-11-02 NOTE — Telephone Encounter (Signed)
Patient called to confirm 11/14 appointment and time, patient is notified.

## 2023-11-02 NOTE — Telephone Encounter (Signed)
Called patient regarding upcoming 11/14 appointments, scheduled per provider orders.

## 2023-11-04 ENCOUNTER — Inpatient Hospital Stay: Payer: BC Managed Care – PPO | Attending: Neurosurgery

## 2023-11-04 DIAGNOSIS — R9389 Abnormal findings on diagnostic imaging of other specified body structures: Secondary | ICD-10-CM | POA: Insufficient documentation

## 2023-11-04 DIAGNOSIS — Z79899 Other long term (current) drug therapy: Secondary | ICD-10-CM | POA: Insufficient documentation

## 2023-11-04 DIAGNOSIS — C7951 Secondary malignant neoplasm of bone: Secondary | ICD-10-CM | POA: Insufficient documentation

## 2023-11-04 DIAGNOSIS — C801 Malignant (primary) neoplasm, unspecified: Secondary | ICD-10-CM | POA: Insufficient documentation

## 2023-11-07 ENCOUNTER — Inpatient Hospital Stay (HOSPITAL_BASED_OUTPATIENT_CLINIC_OR_DEPARTMENT_OTHER): Payer: BC Managed Care – PPO | Admitting: Oncology

## 2023-11-07 VITALS — BP 110/72 | HR 64 | Temp 98.1°F | Resp 17 | Ht 74.0 in | Wt 272.1 lb

## 2023-11-07 DIAGNOSIS — C7951 Secondary malignant neoplasm of bone: Secondary | ICD-10-CM | POA: Diagnosis not present

## 2023-11-07 DIAGNOSIS — Z79899 Other long term (current) drug therapy: Secondary | ICD-10-CM | POA: Diagnosis not present

## 2023-11-07 DIAGNOSIS — R9389 Abnormal findings on diagnostic imaging of other specified body structures: Secondary | ICD-10-CM | POA: Diagnosis not present

## 2023-11-07 DIAGNOSIS — C801 Malignant (primary) neoplasm, unspecified: Secondary | ICD-10-CM | POA: Diagnosis present

## 2023-11-07 DIAGNOSIS — R933 Abnormal findings on diagnostic imaging of other parts of digestive tract: Secondary | ICD-10-CM

## 2023-11-07 MED ORDER — OXYCODONE HCL 5 MG PO TABS: 5.0000 mg | ORAL_TABLET | Freq: Four times a day (QID) | ORAL | 0 refills | Status: AC | PRN

## 2023-11-07 NOTE — Progress Notes (Signed)
[facility]  ONCOLOGY CLINIC PROGRESS NOTE   Patient Care Team: Caffie Damme, MD as PCP - General (Family Medicine)  Date of visit: 11/07/2023   ASSESSMENT & PLAN:   Malignant neoplasm metastatic to bone [facility]) - Please review oncology history for additional details and timeline of events and past workup.  Pathology from epidural mass/laminectomy showed overall benign findings with some atypical lymphoid infiltrate.  Pending FISH panel results. CT scan showed no lymph node enlargement but did show a concerning area in the colon. Patient reports weakness in legs and core, but strength is improving. No bowel issues, weight loss, or other systemic symptoms reported. -Given CT findings and MRI findings, there is still clinical concern for metastatic malignancy of unknown primary.  Lymphomatous process could not be definitively ruled out.  Workup for monoclonal gammopathy came back negative. ?  Nonsecretory myeloma. -We will order PET scan to further evaluate the lesion and any potential lymph node involvement.  Plan is to obtain a rebiopsy of the most FDG avid lesion that can be accessible. -If PET scan is unyielding, next step would be considering bone marrow biopsy.  This will be discussed on future visits. -Continue Pregabalin for neuropathic pain. -Refill pain medication as needed. -Continue to taper Decadron as instructed. -Follow-up with neurosurgeon, Dr. Maisie Fus.  Has appointment scheduled tomorrow. -Plan for phone visit in 3 weeks and in-person visit in 5 weeks.   Abnormal CT scan, colon -Incidentally noted focal narrowing at the junction of the rectum and sigmoid colon on CT scan. No bowel issues or blood in stools reported by the patient.  Recent CEA was within normal limits. -Monitor for any changes in bowel habits or other symptoms. -Discuss further evaluation with colonoscopy if symptoms develop or if PET scan shows increased activity in this area.    I  reviewed lab results and outside records for this visit and discussed relevant results with the patient. Diagnosis, plan of care and treatment options were also discussed in detail with the patient. Opportunity provided to ask questions and answers provided to his apparent satisfaction. Provided instructions to call our clinic with any problems, questions or concerns prior to return visit. I recommended to continue follow-up with PCP and sub-specialists. He verbalized understanding and agreed with the plan.   NCCN guidelines have been consulted in the planning of this patient's care.  I provided 40 minutes of face-to-face time during this encounter and > 50% was spent counseling as documented under my assessment and plan.    Meryl Crutch, MD  11/07/2023 7:50 PM  [facility] - A DEPT OF [facility] [address] [place] [address] Dept: [phone] Dept Fax: [phone]    CHIEF COMPLAINT/ REASON FOR VISIT:   To establish care in our clinic after recent hospitalization for suspected metastatic malignancy with bone lesions in the spine   INTERVAL HISTORY:    Discussed the use of AI scribe software for clinical note transcription with the patient, who gave verbal consent to proceed.   He reports ongoing weakness in legs and abdominal discomfort. He reports that his legs are 'getting stronger' but still feel weak, and he has noticed a decrease in his core strength. He has been using a walker at home and notes that his leg strength has improved compared to before his hospitalization. He also reports that his stomach feels 'tight' and he has been experiencing abdominal discomfort, particularly when walking or  sitting in certain positions. He denies any bowel issues, blood in stools, unintentional weight loss, fevers, chills, or night sweats. He has been taking pain medication as needed and is requesting a refill.  I have  reviewed the past medical history, past surgical history, social history and family history with the patient and they are unchanged from previous note.  ALLERGIES: He has No Known Allergies.  MEDICATIONS:  Current Outpatient Medications  Medication Sig Dispense Refill   pregabalin (LYRICA) 150 MG capsule Take 150 mg by mouth 2 (two) times daily.     oxyCODONE (OXY IR/ROXICODONE) 5 MG immediate release tablet Take 1 tablet (5 mg total) by mouth every 6 (six) hours as needed for moderate pain (pain score 4-6). 30 tablet 0   No current facility-administered medications for this visit.    HISTORY OF PRESENT ILLNESS:   Oncology History  Malignant neoplasm metastatic to bone [facility])  10/23/2023 Initial Diagnosis   Malignant neoplasm metastatic to bone [facility]):  Patient with no significant past medical history presented to the hospital on 10/23/23 for progressive weakness and walking difficulty. He began having mid back pain in February, but in September 2024, began having difficulty with leg weakness and walking. He had workup at [facility] and Dr. Precious Gilding ortho spine and numerous bone lesions were seen in his lumbar spine 10/5. By patient's report, there was concern for myeloma but labs were negative or inconclusive. He began having increased walking difficulty which prompted visit to [facility] ER. He has been walking with a cane but feels like the last few weeks he has gotten worse to the point he may need a wheelchair. He has numbness in his legs and in his trunk from the chest down.Concurrently, he has been experiencing a sensation of numbness in his stomach, which he first noticed approximately two to three weeks ago. The patient denies any other symptoms such as blood in stools, changes in appetite or weight, nausea, vomiting, heartburn, acid reflux, scrotal swelling, or abdominal pain. He has no known family history of cancer and does not smoke. The patient has sought medical attention for  these symptoms since September, undergoing an MRI and CT scan, but the cause of his symptoms remains undetermined.   No family hx of blood cancers/dyscrasias, cancers. He does not smoke.    In the ED, MRI of the thoracic and lumbar spine was concerning for metastatic disease involving nearly all vertebral bodies, sacrum, and iliac bones with extraosseous mass extending from T4-T6 and displacing the spinal cord.    With these findings, neurosurgery and oncology service was consulted for further recommendations.   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  No evidence of regional pathologic fracture.  Extraosseous mass in the posterior right side of the spinal canal extending from mid T4 to mid T6.  This displaced spinal cord and could be compressing the cord.  MRI of the lumbar spine: Metastatic disease affecting all the vertebral bodies throughout the region as well as the sacrum and iliac bones.  No evidence of regional pathologic fracture.  No extraosseous tumor in this region.  Mild noncompressive disc bulges at L4-5 and L5-S1.   10/25/2023 Surgery   Given high-grade cord compression, decision made to resect the epidural tumor which would serve both diagnostic and therapeutic benefits.   On 10/25/2023, Dr. Hoyt Koch, neurosurgery performed thoracic 4, thoracic 5 and thoracic 6 laminectomy, right medial facetectomies,  resection of epidural mass T4-5-6.  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  T5 nerve root was enveloped with tumor, small residual tumor left around nerve root and thin layer in right ventral epidural space.   10/25/2023 Pathology Results   Pathology from epidural mass showed dense hyalinized fibrovascular stroma with an atypical lymphoid infiltrate.  Thoracic spinous process and lamina resection showed benign cortical and trabecular bone  with associated dense fibroconnective tissue and skeletal muscle.  Unremarkable marrow.  Immunohistochemical stains reveal the infiltrate is comprised of a mixture of CD20 positive B lymphocytes and CD3 positive T lymphocytes.  There appear to be more CD20 positive B lymphocytes than CD3 positive T lymphocytes.  The B lymphocytes are positive for seen PAX5 and CD79a.  They are negative for CD10, BCL6 and cyclin D1.  While there appears to be focal CD5 expression, interpretation is limited by the large number of background T lymphocytes.  Bcl-2 and CD43 highlight the T lymphocytes.  Ki-67 proliferation index is low approximately 5%.  Other pertinent negative stains include EBV ISH, kappa/lambda ISH, MUM1-1, and CD30.  CD138 highlights rare plasma cells.  CD21 and CD23 highlight scattered cells and follicular dendritic meshwork.  Flow cytometric analysis does not reveal any evidence of a clonal B-cell population.  While the patient's history of vertebral lesions is noted, no definitive features diagnostic of lymphoma are identified in this specimen, and sampling artifact cannot be excluded.  A low-grade B-cell lymphoma FISH panel will be sent for further characterization and will be reported in an addendum.    10/25/2023 Tumor Marker   CEA, PSA, AFP, beta-hCG, LDH, beta-2 microglobulin were all within normal limits.  SPEP, IFE were unremarkable and showed no monoclonal protein.  Serum free kappa and lambda were within normal limits and ratio was normal at 0.96.  No evidence of anemia, renal dysfunction or hypercalcemia.   10/25/2023 Imaging   CT chest abdomen and pelvis with contrast: Diffuse heterogeneous bone sclerosis, likely indicating metastatic disease or myeloma.  Left paraspinal mass extending from T4-T8 level.  Focal narrowing of the colon at the rectosigmoid junction, possibly mass, stricture or muscular hypertrophy.  Consider endoscopy if clinically indicated.   11/07/2023 Miscellaneous    Pathology from epidural mass/laminectomy showed benign findings with some atypical lymphoid infiltrate.  Clinical picture remains concerning for malignancy/lymphoma.  Request submitted for PET scan for further evaluation.  Plan to rebiopsy the most FDG avid lesion that is accessible.       REVIEW OF SYSTEMS:   Review of Systems - Oncology  All other pertinent systems were reviewed with the patient and are negative.   VITALS:   Blood pressure 110/72, pulse 64, temperature 98.1 F (36.7 C), temperature source Temporal, resp. rate 17, height 6\' 2"  (1.88 m), weight 272 lb 1.6 oz (123.4 kg), SpO2 100%.  Wt Readings from Last 3 Encounters:  11/07/23 272 lb 1.6 oz (123.4 kg)  10/27/23 265 lb (120.2 kg)  07/19/18 275 lb (124.7 kg)    Body mass index is 34.94 kg/m.  Performance status (ECOG): 2 - Symptomatic, <50% confined to bed  PHYSICAL EXAM:   Physical Exam Constitutional:      General: He is not in acute distress.    Appearance: Normal appearance.  HENT:     Head: Normocephalic and atraumatic.  Eyes:     General: No scleral icterus.    Conjunctiva/sclera: Conjunctivae normal.  Cardiovascular:     Rate and Rhythm: Normal rate and regular rhythm.  Heart sounds: Normal heart sounds.  Pulmonary:     Effort: Pulmonary effort is normal.     Breath sounds: Normal breath sounds.  Abdominal:     General: There is no distension.  Musculoskeletal:     Right lower leg: No edema.     Left lower leg: No edema.  Lymphadenopathy:     Cervical: No cervical adenopathy.  Neurological:     Mental Status: He is alert and oriented to person, place, and time.     Comments: Strength improved in both legs.  Power almost normal now.  Psychiatric:        Mood and Affect: Mood normal.        Behavior: Behavior normal.        Thought Content: Thought content normal.      LABORATORY DATA:   I have reviewed the data as listed.  Lab Results  Component Value Date   WBC 13.1 (H)  10/27/2023   NEUTROABS 2.9 10/23/2023   HGB 13.1 10/27/2023   HCT 40.2 10/27/2023   MCV 86.5 10/27/2023   PLT 198 10/27/2023       Component Value Date/Time   NA 137 10/26/2023 0525   K 4.3 10/26/2023 0525   CL 105 10/26/2023 0525   CO2 24 10/26/2023 0525   GLUCOSE 140 (H) 10/26/2023 0525   BUN 13 10/26/2023 0525   CREATININE 0.96 10/27/2023 1327   CALCIUM 8.6 (L) 10/26/2023 0525   GFRNONAA >60 10/27/2023 1327   GFRAA >60 07/19/2018 0839     RADIOGRAPHIC STUDIES:  I have personally reviewed the radiological images as listed and agree with the findings in the report.  DG Thoracic Spine 2 View  Result Date: 10/25/2023 CLINICAL DATA:  Thoracic spine surgery. EXAM: THORACIC SPINE 2 VIEWS COMPARISON:  None Available. FINDINGS: Single fluoroscopic spot images provided. The total fluoroscopic time is 56.8 second with cumulative air Karma of 25 mGy. Orthopedic hardware noted. IMPRESSION: Fluoroscopic spot images during thoracic spine surgery. Electronically Signed   By: Elgie Collard M.D.   On: 10/25/2023 22:52   DG C-Arm 1-60 Min-No Report  Result Date: 10/25/2023 Fluoroscopy was utilized by the requesting physician.  No radiographic interpretation.   DG C-Arm 1-60 Min-No Report  Result Date: 10/25/2023 Fluoroscopy was utilized by the requesting physician.  No radiographic interpretation.   DG C-Arm 1-60 Min-No Report  Result Date: 10/25/2023 Fluoroscopy was utilized by the requesting physician.  No radiographic interpretation.   CT CHEST ABDOMEN PELVIS W CONTRAST  Result Date: 10/25/2023 CLINICAL DATA:  Musculoskeletal neoplasm for staging. EXAM: CT CHEST, ABDOMEN, AND PELVIS WITH CONTRAST TECHNIQUE: Multidetector CT imaging of the chest, abdomen and pelvis was performed following the standard protocol during bolus administration of intravenous contrast. RADIATION DOSE REDUCTION: This exam was performed according to the departmental dose-optimization program which includes  automated exposure control, adjustment of the mA and/or kV according to patient size and/or use of iterative reconstruction technique. CONTRAST:  75mL OMNIPAQUE IOHEXOL 350 MG/ML SOLN COMPARISON:  Chest radiograph 07/19/2018. MRI thoracic and lumbar spine 10/23/2023 FINDINGS: CT CHEST FINDINGS Cardiovascular: No significant vascular findings. Normal heart size. No pericardial effusion. Mediastinum/Nodes: No enlarged mediastinal, hilar, or axillary lymph nodes. Thyroid gland, trachea, and esophagus demonstrate no significant findings. Lungs/Pleura: Lungs are clear. No pleural effusion or pneumothorax. Musculoskeletal: Heterogeneous sclerosis throughout the thoracic vertebrae and sternum consistent with diffuse metastatic disease or myeloma. This is better demonstrated on prior MRI. See previous report. Nodular soft tissue process along the left posterior  paraspinal space extending from T4 through T8 consistent with primary or metastatic disease. See also report of previous thoracic spine MRI. CT ABDOMEN PELVIS FINDINGS Hepatobiliary: No focal liver abnormality is seen. No gallstones, gallbladder wall thickening, or biliary dilatation. Pancreas: Unremarkable. No pancreatic ductal dilatation or surrounding inflammatory changes. Spleen: Normal in size without focal abnormality. Adrenals/Urinary Tract: Adrenal glands are unremarkable. Kidneys are normal, without renal calculi, focal lesion, or hydronephrosis. Bladder is unremarkable. Stomach/Bowel: Stomach, small bowel, and colon are not abnormally distended. Stool diffusely fills the colon. There is a focal area of narrowing and shouldering of the colon at the rectosigmoid junction. Proximal and distal colon/rectum at this level are diffusely stool-filled. This could represent a focal neoplasm, stricture, or muscular hypertrophy. Appendix is normal. Vascular/Lymphatic: Minimal aortic calcification. No aneurysm. No significant lymphadenopathy. Reproductive: Prostate is  unremarkable. Other: No abdominal wall hernia or abnormality. No abdominopelvic ascites. Musculoskeletal: Heterogeneous sclerosis throughout the lumbar vertebrae, sacrum, and pelvis consistent with diffuse metastatic disease or myeloma. See also report of prior lumbar spine MRI. IMPRESSION: 1. Diffuse heterogeneous bone sclerosis likely indicating metastatic disease or myeloma. See additional report of MRI thoracic and lumbar spine. 2. Left paraspinal mass extending from T4 through T8 level. 3. Focal narrowing of the colon at the rectosigmoid junction, possibly mass, stricture, or muscular hypertrophy. Consider endoscopy if clinically indicated. Electronically Signed   By: Burman Nieves M.D.   On: 10/25/2023 16:18   MR Lumbar Spine W Wo Contrast  Result Date: 10/23/2023 CLINICAL DATA:  Myelopathy.  Weakness of the lower extremities. EXAM: MRI LUMBAR SPINE WITHOUT AND WITH CONTRAST TECHNIQUE: Multiplanar and multiecho pulse sequences of the lumbar spine were obtained without and with intravenous contrast. CONTRAST:  10mL GADAVIST GADOBUTROL 1 MMOL/ML IV SOLN COMPARISON:  None Available. FINDINGS: Segmentation:  5 lumbar type vertebral bodies. Alignment:  Normal Vertebrae: Metastatic disease affecting all the vertebral bodies throughout the region as well as the sacrum in the iliac bones. No evidence of regional pathologic fracture. No extraosseous tumor in this region. Conus medullaris and cauda equina: Conus extends to the T12 level. Conus and cauda equina appear normal. Paraspinal and other soft tissues: No abnormality seen. Disc levels: Mild non-compressive disc bulges at L4-5 and L5-S1. IMPRESSION: 1. Metastatic disease affecting all the vertebral bodies throughout the region as well as the sacrum and iliac bones. No evidence of regional pathologic fracture. No extraosseous tumor in this region. 2. Mild non-compressive disc bulges at L4-5 and L5-S1. 3. Critical Value/emergent results were called by  telephone at the time of interpretation on 10/23/2023 at 4:51 pm to provider [facility] , who verbally acknowledged these results. Electronically Signed   By: Paulina Fusi M.D.   On: 10/23/2023 16:53   MR THORACIC SPINE W WO CONTRAST  Result Date: 10/23/2023 CLINICAL DATA:  Myelopathy, acute. Bilateral lower extremity weakness. Balance disturbance. Symptoms began over the last few months. EXAM: MRI THORACIC WITHOUT AND WITH CONTRAST TECHNIQUE: Multiplanar and multiecho pulse sequences of the thoracic spine were obtained without and with intravenous contrast. CONTRAST:  10mL GADAVIST GADOBUTROL 1 MMOL/ML IV SOLN COMPARISON:  None Available. FINDINGS: Alignment:  No malalignment. Vertebrae: There is widespread metastatic disease or myeloma throughout the spine. Virtually every level is involved. There is no evidence of regional pathologic fracture. There is an intraspinal mass which appears to be extradural extending from mid T4 to mid T6 in the posterior right side of the spinal canal. This displaces the spinal cord and could be compressing the cord.  No other extraosseous tumor seen in the region. Paraspinal and other soft tissues: No para spinous lesion identified. Disc levels: No evidence of disc level pathology. No degenerative disease or herniation. IMPRESSION: 1. 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 Virtually every level is involved. No evidence of regional pathologic fracture. 2. Extraosseous mass in the posterior right side of the spinal canal extending from mid T4 to mid T6. This displaces the spinal cord and could be compressing the cord. 3. Call report in progress. Electronically Signed   By: Paulina Fusi M.D.   On: 10/23/2023 16:48    CODE STATUS:  Code Status History     Date Active Date Inactive Code Status Order ID Comments User Context   10/23/2023 2205 10/28/2023 2301 Full Code [id number]  Briscoe Deutscher, MD Inpatient    Questions for Most Recent Historical  Code Status (Order [id number])     Question Answer   By: Consent: discussion documented in EHR            Orders Placed This Encounter  Procedures   NM PET Image Initial (PI) Skull Base To Thigh    Standing Status:   Future    Standing Expiration Date:   11/06/2024    Order Specific Question:   If indicated for the ordered procedure, I authorize the administration of a radiopharmaceutical per Radiology protocol    Answer:   Yes    Order Specific Question:   Preferred imaging location?    Answer:   [facility]     Future Appointments  Date Time Provider Department Center  11/28/2023 11:00 AM Wesleigh Markovic, Archie Patten, MD [facility] None  12/11/2023 11:00 AM Harlin Mazzoni, Archie Patten, MD [facility] None      This document was completed utilizing speech recognition software. Grammatical errors, random word insertions, pronoun errors, and incomplete sentences are an occasional consequence of this system due to software limitations, ambient noise, and hardware issues. Any formal questions or concerns about the content, text or information contained within the body of this dictation should be directly addressed to the provider for clarification.

## 2023-11-08 ENCOUNTER — Encounter: Payer: Self-pay | Admitting: Oncology

## 2023-11-08 DIAGNOSIS — R933 Abnormal findings on diagnostic imaging of other parts of digestive tract: Secondary | ICD-10-CM | POA: Insufficient documentation

## 2023-11-08 NOTE — Assessment & Plan Note (Signed)
-  Incidentally noted focal narrowing at the junction of the rectum and sigmoid colon on CT scan. No bowel issues or blood in stools reported by the patient.  Recent CEA was within normal limits. -Monitor for any changes in bowel habits or other symptoms. -Discuss further evaluation with colonoscopy if symptoms develop or if PET scan shows increased activity in this area.

## 2023-11-08 NOTE — Assessment & Plan Note (Addendum)
-   Please review oncology history for additional details and timeline of events and past workup.  Pathology from epidural mass/laminectomy showed overall benign findings with some atypical lymphoid infiltrate.  Pending FISH panel results. CT scan showed no lymph node enlargement but did show a concerning area in the colon. Patient reports weakness in legs and core, but strength is improving. No bowel issues, weight loss, or other systemic symptoms reported. -Given CT findings and MRI findings, there is still clinical concern for metastatic malignancy of unknown primary.  Lymphomatous process could not be definitively ruled out.  Workup for monoclonal gammopathy came back negative. ?  Nonsecretory myeloma. -We will order PET scan to further evaluate the lesion and any potential lymph node involvement.  Plan is to obtain a rebiopsy of the most FDG avid lesion that can be accessible. -If PET scan is unyielding, next step would be considering bone marrow biopsy.  This will be discussed on future visits. -Continue Pregabalin for neuropathic pain. -Refill pain medication as needed. -Continue to taper Decadron as instructed. -Follow-up with neurosurgeon, Dr. Maisie Fus.  Has appointment scheduled tomorrow. -Plan for phone visit in 3 weeks and in-person visit in 5 weeks.

## 2023-11-11 ENCOUNTER — Encounter (HOSPITAL_COMMUNITY): Payer: Self-pay

## 2023-11-12 LAB — SURGICAL PATHOLOGY

## 2023-11-14 ENCOUNTER — Other Ambulatory Visit: Payer: Self-pay | Admitting: Surgery

## 2023-11-14 DIAGNOSIS — D439 Neoplasm of uncertain behavior of central nervous system, unspecified: Secondary | ICD-10-CM

## 2023-11-19 ENCOUNTER — Ambulatory Visit: Payer: BC Managed Care – PPO

## 2023-11-19 ENCOUNTER — Encounter (HOSPITAL_COMMUNITY)
Admission: RE | Admit: 2023-11-19 | Discharge: 2023-11-19 | Disposition: A | Discharge status: Home / Self Care | Payer: BC Managed Care – PPO | Source: Ambulatory Visit | Attending: Oncology | Admitting: Oncology

## 2023-11-19 DIAGNOSIS — C7951 Secondary malignant neoplasm of bone: Secondary | ICD-10-CM | POA: Diagnosis present

## 2023-11-19 DIAGNOSIS — D439 Neoplasm of uncertain behavior of central nervous system, unspecified: Secondary | ICD-10-CM

## 2023-11-19 DIAGNOSIS — M5384 Other specified dorsopathies, thoracic region: Secondary | ICD-10-CM | POA: Diagnosis not present

## 2023-11-19 LAB — GLUCOSE, CAPILLARY: Glucose-Capillary: 90 mg/dL (ref 70–99)

## 2023-11-19 MED ORDER — GADOBUTROL 1 MMOL/ML IV SOLN
10.0000 mL | Freq: Once | INTRAVENOUS | Status: AC | PRN
  Administered 2023-11-19: 10 mL via INTRAVENOUS

## 2023-11-19 MED ORDER — FLUDEOXYGLUCOSE F - 18 (FDG) INJECTION
11.1000 | Freq: Once | INTRAVENOUS | Status: AC
  Administered 2023-11-19: 11.1 via INTRAVENOUS

## 2023-11-28 ENCOUNTER — Inpatient Hospital Stay: Payer: BC Managed Care – PPO | Admitting: Oncology

## 2023-12-06 ENCOUNTER — Inpatient Hospital Stay: Payer: BC Managed Care – PPO | Attending: Oncology | Admitting: Oncology

## 2023-12-06 ENCOUNTER — Other Ambulatory Visit: Payer: Self-pay

## 2023-12-06 DIAGNOSIS — R933 Abnormal findings on diagnostic imaging of other parts of digestive tract: Secondary | ICD-10-CM | POA: Diagnosis not present

## 2023-12-06 DIAGNOSIS — C7951 Secondary malignant neoplasm of bone: Secondary | ICD-10-CM | POA: Diagnosis not present

## 2023-12-06 NOTE — Progress Notes (Unsigned)
[facility]  HEMATOLOGY-ONCOLOGY ELECTRONIC VISIT PROGRESS NOTE  Patient Care Team: Caffie Damme, MD as PCP - General (Family Medicine)  I connected with the patient via telephone conference and verified that I am speaking with the correct person using two identifiers. The patient's location is at home and I am providing care from the [facility].  I discussed the limitations, risks, security and privacy concerns of performing an evaluation and management service by e-visits and the availability of in person appointments.  I also discussed with the patient that there may be a patient responsible charge related to this service. The patient expressed understanding and agreed to proceed.   ASSESSMENT & PLAN:   No problem-specific Assessment & Plan notes found for this encounter.   No orders of the defined types were placed in this encounter.   INTERVAL HISTORY:  Please see above for problem oriented charting.  The purpose of today's discussion is to explain recent lab results and to formulate plan of care.  SUMMARY OF ONCOLOGIC HISTORY:  Oncology History  Malignant neoplasm metastatic to bone [facility])  10/23/2023 Initial Diagnosis   Malignant neoplasm metastatic to bone [facility]):  Patient with no significant past medical history presented to the hospital on 10/23/23 for progressive weakness and walking difficulty. He began having mid back pain in February, but in September 2024, began having difficulty with leg weakness and walking. He had workup at [facility] and Dr. Precious Gilding ortho spine and numerous bone lesions were seen in his lumbar spine 10/5. By patient's report, there was concern for myeloma but labs were negative or inconclusive. He began having increased walking difficulty which prompted visit to [facility] ER. He has been walking with a cane but feels like the last few weeks he has gotten worse to the point he may need a wheelchair. He has numbness in his  legs and in his trunk from the chest down.Concurrently, he has been experiencing a sensation of numbness in his stomach, which he first noticed approximately two to three weeks ago. The patient denies any other symptoms such as blood in stools, changes in appetite or weight, nausea, vomiting, heartburn, acid reflux, scrotal swelling, or abdominal pain. He has no known family history of cancer and does not smoke. The patient has sought medical attention for these symptoms since September, undergoing an MRI and CT scan, but the cause of his symptoms remains undetermined.   No family hx of blood cancers/dyscrasias, cancers. He does not smoke.    In the ED, MRI of the thoracic and lumbar spine was concerning for metastatic disease involving nearly all vertebral bodies, sacrum, and iliac bones with extraosseous mass extending from T4-T6 and displacing the spinal cord.    With these findings, neurosurgery and oncology service was consulted for further recommendations.   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  No evidence of regional pathologic fracture.  Extraosseous mass in the posterior right side of the spinal canal extending from mid T4 to mid T6.  This displaced spinal cord and could be compressing the cord.  MRI of the lumbar spine: Metastatic disease affecting all the vertebral bodies throughout the region as well as the sacrum and iliac bones.  No evidence of regional pathologic fracture.  No extraosseous tumor in this region.  Mild noncompressive disc bulges at L4-5 and L5-S1.   10/25/2023 Surgery   Given high-grade cord compression, decision made  to resect the epidural tumor which would serve both diagnostic and therapeutic benefits.   On 10/25/2023, Dr. Hoyt Koch, neurosurgery performed thoracic 4, thoracic 5 and thoracic 6 laminectomy, right medial facetectomies, resection of epidural  mass T4-5-6.  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  T5 nerve root was enveloped with tumor, small residual tumor left around nerve root and thin layer in right ventral epidural space.   10/25/2023 Pathology Results   Pathology from epidural mass showed dense hyalinized fibrovascular stroma with an atypical lymphoid infiltrate.  Thoracic spinous process and lamina resection showed benign cortical and trabecular bone with associated dense fibroconnective tissue and skeletal muscle.  Unremarkable marrow.  Immunohistochemical stains reveal the infiltrate is comprised of a mixture of CD20 positive B lymphocytes and CD3 positive T lymphocytes.  There appear to be more CD20 positive B lymphocytes than CD3 positive T lymphocytes.  The B lymphocytes are positive for seen PAX5 and CD79a.  They are negative for CD10, BCL6 and cyclin D1.  While there appears to be focal CD5 expression, interpretation is limited by the large number of background T lymphocytes.  Bcl-2 and CD43 highlight the T lymphocytes.  Ki-67 proliferation index is low approximately 5%.  Other pertinent negative stains include EBV ISH, kappa/lambda ISH, MUM1-1, and CD30.  CD138 highlights rare plasma cells.  CD21 and CD23 highlight scattered cells and follicular dendritic meshwork.  Flow cytometric analysis does not reveal any evidence of a clonal B-cell population.  While the patient's history of vertebral lesions is noted, no definitive features diagnostic of lymphoma are identified in this specimen, and sampling artifact cannot be excluded.  A low-grade B-cell lymphoma FISH panel will be sent for further characterization and will be reported in an addendum.    10/25/2023 Tumor Marker   CEA, PSA, AFP, beta-hCG, LDH, beta-2 microglobulin were all within normal limits.  SPEP, IFE were unremarkable and showed no monoclonal protein.  Serum free kappa and lambda were within normal limits and ratio was normal at  0.96.  No evidence of anemia, renal dysfunction or hypercalcemia.   10/25/2023 Imaging   CT chest abdomen and pelvis with contrast: Diffuse heterogeneous bone sclerosis, likely indicating metastatic disease or myeloma.  Left paraspinal mass extending from T4-T8 level.  Focal narrowing of the colon at the rectosigmoid junction, possibly mass, stricture or muscular hypertrophy.  Consider endoscopy if clinically indicated.   11/07/2023 Miscellaneous   Pathology from epidural mass/laminectomy showed benign findings with some atypical lymphoid infiltrate.  Clinical picture remains concerning for malignancy/lymphoma.  Request submitted for PET scan for further evaluation.  Plan to rebiopsy the most FDG avid lesion that is accessible.     REVIEW OF SYSTEMS:    Review of Systems - Oncology  All other pertinent systems were reviewed with the patient and are negative.  I have reviewed the past medical history, past surgical history, social history and family history with the patient and they are unchanged from previous note.  ALLERGIES:  He has no known allergies.  MEDICATIONS:  Current Outpatient Medications  Medication Sig Dispense Refill   oxyCODONE (OXY IR/ROXICODONE) 5 MG immediate release tablet Take 1 tablet (5 mg total) by mouth every 6 (six) hours as needed for moderate pain (pain score 4-6). 30 tablet 0   pregabalin (LYRICA) 150 MG capsule Take 150 mg by mouth 2 (two) times daily.     No current facility-administered medications for this visit.    PHYSICAL EXAMINATION: ECOG PERFORMANCE STATUS: 1 - Symptomatic  but completely ambulatory  LABORATORY DATA:   I have reviewed the data as listed.  Recent Results (from the past 2160 hours)  CBC with Differential     Status: None   Collection Time: 10/23/23  2:34 PM  Result Value Ref Range   WBC 5.3 4.0 - 10.5 K/uL   RBC 4.91 4.22 - 5.81 MIL/uL   Hemoglobin 14.0 13.0 - 17.0 g/dL   HCT 24.4 01.0 - 27.2 %   MCV 87.2 80.0 - 100.0 fL    MCH 28.5 26.0 - 34.0 pg   MCHC 32.7 30.0 - 36.0 g/dL   RDW 53.6 64.4 - 03.4 %   Platelets 203 150 - 400 K/uL   nRBC 0.0 0.0 - 0.2 %   Neutrophils Relative % 55 %   Neutro Abs 2.9 1.7 - 7.7 K/uL   Lymphocytes Relative 37 %   Lymphs Abs 1.9 0.7 - 4.0 K/uL   Monocytes Relative 7 %   Monocytes Absolute 0.4 0.1 - 1.0 K/uL   Eosinophils Relative 1 %   Eosinophils Absolute 0.1 0.0 - 0.5 K/uL   Basophils Relative 0 %   Basophils Absolute 0.0 0.0 - 0.1 K/uL   Immature Granulocytes 0 %   Abs Immature Granulocytes 0.01 0.00 - 0.07 K/uL    Comment: Performed at [facility], [address] [place], [place] [address]  Basic metabolic panel     Status: None   Collection Time: 10/23/23  2:34 PM  Result Value Ref Range   Sodium 139 135 - 145 mmol/L   Potassium 3.5 3.5 - 5.1 mmol/L   Chloride 101 98 - 111 mmol/L   CO2 29 22 - 32 mmol/L   Glucose, Bld 91 70 - 99 mg/dL    Comment: Glucose reference range applies only to samples taken after fasting for at least 8 hours.   BUN 10 6 - 20 mg/dL   Creatinine, Ser 5.63 0.61 - 1.24 mg/dL   Calcium 9.1 8.9 - 87.5 mg/dL   GFR, Estimated >64 >33 mL/min    Comment: (NOTE) Calculated using the CKD-EPI Creatinine Equation (2021)    Anion gap 9 5 - 15    Comment: Performed at [facility], [address] [place], [place] [address]  HIV Antibody (routine testing w rflx)     Status: None   Collection Time: 10/24/23  7:28 AM  Result Value Ref Range   HIV Screen 4th Generation wRfx Non Reactive Non Reactive    Comment: Performed at [facility], [address] [place], [place] [address]  Basic metabolic panel     Status: Abnormal   Collection Time: 10/24/23  7:28 AM  Result Value Ref Range   Sodium 140 135 - 145 mmol/L   Potassium 3.7 3.5 - 5.1 mmol/L   Chloride 105 98 - 111 mmol/L   CO2 25 22 - 32 mmol/L   Glucose, Bld 108 (H) 70 - 99 mg/dL    Comment: Glucose reference range applies only to samples taken after fasting  for at least 8 hours.   BUN 9 6 - 20 mg/dL   Creatinine, Ser 0.63 0.61 - 1.24 mg/dL   Calcium 9.1 8.9 - 01.6 mg/dL   GFR, Estimated >01 >09 mL/min    Comment: (NOTE) Calculated using the CKD-EPI Creatinine Equation (2021)    Anion gap 10 5 - 15    Comment: Performed at [facility], [address], [place], [place] [address]  CBC     Status: None   Collection Time: 10/24/23  7:28 AM  Result Value Ref Range   WBC 5.3 4.0 - 10.5 K/uL   RBC 5.07 4.22 - 5.81 MIL/uL   Hemoglobin 14.2 13.0 - 17.0 g/dL   HCT 69.6 29.5 - 28.4 %   MCV 87.0 80.0 - 100.0 fL   MCH 28.0 26.0 - 34.0 pg   MCHC 32.2 30.0 - 36.0 g/dL   RDW 13.2 44.0 - 10.2 %   Platelets 202 150 - 400 K/uL   nRBC 0.0 0.0 - 0.2 %    Comment: Performed at [facility], [address], [place], [place] [address]  ABO/Rh     Status: None   Collection Time: 10/24/23  7:28 AM  Result Value Ref Range   ABO/RH(D)      O POS Performed at [facility], [address], [place], [place] [address]   Multiple Myeloma Panel (SPEP&IFE w/QIG)     Status: None   Collection Time: 10/24/23 11:43 AM  Result Value Ref Range   IgG (Immunoglobin G), Serum 1,265 603 - 1,613 mg/dL   IgA 474 90 - 259 mg/dL   IgM (Immunoglobulin M), Srm 104 20 - 172 mg/dL   Total Protein ELP 7.5 6.0 - 8.5 g/dL   Albumin SerPl Elph-Mcnc 4.3 2.9 - 4.4 g/dL   Alpha 1 0.2 0.0 - 0.4 g/dL   Alpha2 Glob SerPl Elph-Mcnc 0.7 0.4 - 1.0 g/dL   B-Globulin SerPl Elph-Mcnc 1.1 0.7 - 1.3 g/dL   Gamma Glob SerPl Elph-Mcnc 1.2 0.4 - 1.8 g/dL   M Protein SerPl Elph-Mcnc Not Observed Not Observed g/dL   Globulin, Total 3.2 2.2 - 3.9 g/dL   Albumin/Glob SerPl 1.4 0.7 - 1.7   IFE 1 Comment     Comment: (NOTE) The immunofixation pattern appears unremarkable. Evidence of monoclonal protein is not apparent.    Please Note Comment     Comment: (NOTE) Protein electrophoresis scan will follow via computer, mail, or courier delivery. Performed At: [facility] [address] [place], [place] [id number] Jolene Schimke MD PI:[id number]   Kappa/lambda light chains     Status: None   Collection Time: 10/24/23 11:43 AM  Result Value Ref Range   Kappa free light chain 13.4 3.3 - 19.4 mg/L   Lambda free light chains 13.9 5.7 - 26.3 mg/L   Kappa, lambda light chain ratio 0.96 0.26 - 1.65    Comment: (NOTE) Performed At: [facility] [address] [place], [place] [id number] Jolene Schimke MD [id number]   Lactate dehydrogenase     Status: None   Collection Time: 10/24/23 11:43 AM  Result Value Ref Range   LDH 171 98 - 192 U/L    Comment: Performed at [facility], [address], [place], [place] [address]  Type and screen [facility]     Status: None   Collection Time: 10/24/23  8:06 PM  Result Value Ref Range   ABO/RH(D) O POS    Antibody Screen NEG    Sample Expiration      10/27/2023,2359 Performed at [facility], [address], [place], [place] [address]   Surgical pathology     Status: None   Collection Time: 10/25/23 12:00 AM  Result Value Ref Range   SURGICAL PATHOLOGY      Surgical Pathology CASE: [id number] PATIENT: Alexia Freestone Flow Pathology Report     Clinical history: Epidural  Mass     DIAGNOSIS:  - No abnormal B or T-cell population identified  GATING AND PHENOTYPIC ANALYSIS:  Gated population: Flow cytometric immunophenotyping is performed using antibodies to the antigens listed in the table below. Electronic gates are placed around a cell cluster displaying light scatter properties corresponding to: lymphocytes  Abnormal Cells in gated population: N/A  Phenotype of Abnormal Cells: N/A                       Lymphoid Antigens       Myeloid Antigens Miscellaneous CD2  tested    CD10 tested    CD11b     ND   CD45 tested CD3  tested    CD19 tested    CD11c     ND   HLA-Dr    ND CD4  tested    CD20 tested    CD13 ND   CD34 tested CD5  tested    CD22  ND   CD14 ND   CD38 tested CD7  tested    CD79b     ND   CD15 ND   CD138     ND CD8  tested    CD103     ND   CD16 ND   TdT  ND CD25 ND   CD200     tested    CD33 ND    CD123     ND TCRab     ND   sKappa    tested    CD64 ND   CD41 ND TCRgd     tested    sLambda   tested    CD117     ND   CD61 ND CD56 tested    cKappa    ND   MPO  ND   CD71 ND CD57 ND   cLambda   ND        [id number]      GROSS DESCRIPTION:  Reference Tissue case [phone].    Final Diagnosis performed by Clifton James, MD.   Electronically signed 11/01/2023 Technical and / or Professional components performed at [facility], [address] [place], [place] [address].  The above tests were developed and their performance characteristics determined by the [facility] system for the physical and immunophenotypic characterization of cell populations. They have not been cleared by the U.S. Food and Drug administration. The  FDA has determined that such clearance or approval is not necessary. This test is used for clinical purposes. It should not be  regarded as investigational or for research   Basic metabolic panel     Status: Abnormal   Collection Time: 10/25/23  5:06 AM  Result Value Ref Range   Sodium 139 135 - 145 mmol/L   Potassium 3.8 3.5 - 5.1 mmol/L   Chloride 107 98 - 111 mmol/L   CO2 24 22 - 32 mmol/L   Glucose, Bld 180 (H) 70 - 99 mg/dL    Comment: Glucose reference range applies only to samples taken after fasting for at least 8 hours.   BUN 12 6 - 20 mg/dL   Creatinine, Ser 3.29 0.61 - 1.24 mg/dL   Calcium 9.3 8.9 - 51.8 mg/dL   GFR, Estimated >84 >16 mL/min    Comment: (NOTE) Calculated using the CKD-EPI Creatinine Equation (2021)    Anion gap 8 5 - 15    Comment: Performed at [facility], [address], [place], [place] [address]  CBC     Status: Abnormal   Collection Time: 10/25/23  5:06 AM  Result Value Ref Range   WBC 10.6 (H) 4.0 - 10.5 K/uL   RBC 5.24  4.22 - 5.81 MIL/uL   Hemoglobin 14.9 13.0 - 17.0 g/dL   HCT 21.3 08.6 - 57.8 %   MCV 86.1 80.0 - 100.0 fL   MCH 28.4 26.0 - 34.0 pg   MCHC 33.0 30.0 - 36.0 g/dL   RDW 46.9 62.9 - 52.8 %   Platelets 235 150 - 400 K/uL   nRBC 0.0 0.0 - 0.2 %    Comment: Performed at [facility], [address], [place], [place] [address]  Beta 2 microglobulin, serum     Status: None   Collection Time: 10/25/23  5:06 AM  Result Value Ref Range   Beta-2 Microglobulin 1.1 0.6 - 2.4 mg/L    Comment: (NOTE) Siemens Immulite 2000 Immunochemiluminometric assay (ICMA) Values obtained with different assay methods or kits cannot be used interchangeably. Results cannot be interpreted as absolute evidence of the presence or absence of malignant disease. Performed At: [facility] [address] [place], [place] [id number] Jolene Schimke MD [id number]   AFP tumor marker     Status: None   Collection Time: 10/25/23  5:06 AM  Result Value Ref Range   AFP, Serum, Tumor Marker 3.4 0.0 - 6.9 ng/mL    Comment: (NOTE) Roche [employer]ostics Electrochemiluminescence Immunoassay (ECLIA) Values obtained with different assay methods or kits cannot be used interchangeably.  Results cannot be interpreted as absolute evidence of the presence or absence of malignant disease. This test is not interpretable in pregnant females. Performed At: [facility] [address] [place], [place] [id number] Jolene Schimke MD PI:[id number]   CEA     Status: None   Collection Time: 10/25/23  5:06 AM  Result Value Ref Range   CEA 2.8 0.0 - 4.7 ng/mL    Comment: (NOTE)                             Nonsmokers          <3.9                             Smokers             <5.6 Roche [employer]ostics Electrochemiluminescence Immunoassay (ECLIA) Values obtained with different assay methods or kits cannot be used interchangeably.  Results cannot be interpreted as absolute evidence of the presence or absence of malignant  disease. Performed At: [facility] [address] [place], [place] [id number] Jolene Schimke MD [id number]   Beta hCG quant (ref lab)     Status: None   Collection Time: 10/25/23  5:06 AM  Result Value Ref Range   hCG Quant <1 0 - 3 mIU/mL    Comment: (NOTE) Roche ECLIA methodology Performed At: [facility] [address] [place], [place] [id number] Jolene Schimke MD [id number]   PSA     Status: None   Collection Time: 10/25/23  5:06 AM  Result Value Ref Range   Prostatic Specific Antigen 0.45 0.00 - 4.00 ng/mL    Comment: (NOTE) While PSA levels of <=4.00 ng/ml are reported as reference range, some men with levels below 4.00 ng/ml can have prostate cancer and many men with PSA above 4.00 ng/ml do not have prostate cancer.  Other tests such  as free PSA, age specific reference ranges, PSA velocity and PSA doubling time may be helpful especially in men less than 12 years old. Performed at [facility], [address] [place], [place] [address]   Surgical pcr screen     Status: None   Collection Time: 10/25/23  9:39 AM   Specimen: Nasal Mucosa; Nasal Swab  Result Value Ref Range   MRSA, PCR NEGATIVE NEGATIVE   Staphylococcus aureus NEGATIVE NEGATIVE    Comment: (NOTE) The Xpert SA Assay (FDA approved for NASAL specimens in patients 40 years of age and older), is one component of a comprehensive surveillance program. It is not intended to diagnose infection nor to guide or monitor treatment. Performed at [facility], [address], [place], [place] [address]   Surgical pathology     Status: None   Collection Time: 10/25/23  4:58 PM  Result Value Ref Range   SURGICAL PATHOLOGY      SURGICAL PATHOLOGY **** THIS IS AN ADDENDUM REPORT **** CASE: [id number] PATIENT: Alexia Freestone Surgical Pathology Report **********Addendum **********  Reason for Addendum #1:  Ancillary Studies  Clinical History: epidural mass (cm)   -  FINAL  MICROSCOPIC DIAGNOSIS:  A. THORACIC EPIDURAL MASS, RESECTION: - Dense hyalinized fibrovascular stroma with an atypical lymphoid infiltrate  B. THORACIC SPINOUS PROCESS AND LAMINA, RESECTION: - Benign cortical and trabecular bone with associated dense fibroconnective tissue and skeletal muscle - Unremarkable marrow  C. THORACIC EPIDURAL MASS, RESECTION: - Dense hyalinized fibrovascular stroma with an atypical lymphoid infiltrate.  See comment.  COMMENT:  Morphologic evaluation from part A and C reveals an atypical lymphoid infiltrate in the background of dense hyalinized fibrovascular stroma. The lymphocytes appear small to medium sized.  Immunohistochemical stains reveal the infiltrate is comprised of a mixture  of CD20 positive B lymphocytes and CD3 positive T lymphocytes.  There appear to be more CD20 positive B lymphocytes than CD3 positive T lymphocytes.  The B lymphocytes are positive for seen PAX5 and CD79a.  They are negative for CD10, BCL6 and cyclin D1.  While there appears to be focal CD5 expression, interpretation is limited by the large number of background T lymphocytes.  Bcl-2 and CD43 highlight the T lymphocytes.  Ki-67 proliferation index is low approximately 5%.  Other pertinent negative stains include EBV ISH, kappa/lambda ISH, MUM1-1, and CD30.  CD138 highlights rare plasma cells.  CD21 and CD23 highlight scattered cells and follicular dendritic meshwork.  Flow cytometric analysis does not reveal any evidence of a clonal B-cell population.  While the patient's history of vertebral lesions is noted, no definitive features diagnostic of lymphoma are identified in this specimen, and sampling artifact cannot be excluded.  A low-grade B-cell lymphoma FISH panel wil l be sent for further characterization and will be reported in an addendum. Correlation with pending studies, clinical findings such as other sites of lymphadenopathy, and imaging are  recommended.   INTRAOPERATIVE DIAGNOSIS:  A.  Thoracic Epidural Mass: "lymphoplasmacytic infiltrate." Intraoperative diagnosis rendered by Dr. Maurice March at 17:40 on 25 October 2023.  GROSS DESCRIPTION:  A. Received fresh for intraoperative consultation labeled with the patient's name and "Thoracic epidural mass" is a 0.8 x 0.7 x 0.2 cm piece of tan soft tissue that is entirely submitted on a single chuck for frozen section diagnosis and subsequently in cassette A1 for permanent.  B. Received in saline labeled with the patient's name and "Thoracic spinous process and lamina" is a 3.4 x 2.9 x 1.0  cm aggregate of tan, firm, jagged bone with a minimal amount of soft tissue attached. Representative sections are submitted as follows: B1: soft tissue B2-B3; bony tissue after decalcification   C. Received in saline labeled with the patient's name and "Thoracic epidural mass" is a 2.7 x 2.7 x 1.1 cm aggregate of tan, ragged soft tissue and jagged, firm bony tissue. A piece is placed in RPMI and sent to [facility] for possible flow cytometry. A representative portion is submitted in three cassettes after decalcification in Immunocal.  (LEF 10/28/2023)  Final Diagnosis performed by Clifton James, MD.   Electronically signed 11/01/2023 Technical component performed at [employer]. [facility], [address] [place], [place] [address].  Professional component performed at [facility], [address] [place], [place] [address].  Immunohistochemistry Technical component (if applicable) was performed at [facility]. [address], [place], [place] [address].   IMMUNOHISTOCHEMISTRY DISCLAIMER (if applicable): Some of these immunohistochemical stains may have been developed and the performance charac teristics determine by [facility]. Some may not have been cleared or approved by the U.S. Food and  Drug Administration. The FDA has determined that such clearance or approval is not necessary. This test is used for clinical purposes. It should not be regarded as investigational or for research. This laboratory is certified under the Clinical Laboratory Improvement Amendments of 1988 (CLIA-88) as qualified to perform high complexity clinical laboratory testing.  The controls stained appropriately.   IHC stains are performed on formalin fixed, paraffin embedded tissue using a 3,3"diaminobenzidine (DAB) chromogen and Leica Bond Autostainer System. The staining intensity of the nucleus is score manually and is reported as the percentage of tumor cell nuclei demonstrating specific nuclear staining. The specimens are fixed in 10% Neutral Formalin for at least 6 hours and up to 72hrs. These tests are validated on decalcified tissue. Results should be interpreted wi th caution given the possibility of false negative results on decalcified specimens. Antibody Clones are as follows ER-clone 82F, PR-clone 16, Ki67- clone MM1. Some of these immunohistochemical stains may have been developed and the performance characteristics determined by [facility].  ADDENDUM: - A Low-grade B-cell lymphoma FISH panel performed at [facility] revealed an IgH/Bcl-2 translocation.  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  However, in the absence of a significant morphologic correlate and absence of clonality on flow cytometry, a definitive diagnosis of lymphoma cannot be rendered.  Moreover, IgH/Bcl-2 translocation may be seen in normal individuals as well. Therefore, repeat sampling from potential sites of lymphadenopathy, along with peripheral blood flow cytometry may be performed as clinically indicated for more definitive assessment.         Addendum #1 per formed by Clifton James, MD.   Electronically  signed 11/12/2023 Technical component performed at [facility]. [facility], [address] [place], [place] [address].  Professional component performed at [facility], [address] [place], [place] [address].  Immunohistochemistry Technical component (if applicable) was performed at [facility]. [address], [place], [place] [address].   IMMUNOHISTOCHEMISTRY DISCLAIMER (if applicable): Some of these immunohistochemical stains may have been developed and the performance characteristics determine by [facility] Some may not have been cleared or approved by the U.S. Food and Drug Administration. The FDA has determined that such clearance or approval is not necessary. This test is used for clinical purposes.  It should not be regarded as investigational or for research. This laboratory is certified under the Clinical Laboratory Improvement Amendments of 198 8 (CLIA-88) as qualified to perform high complexity clinical laboratory testing.  The controls stained appropriately.   IHC stains are performed on formalin fixed, paraffin embedded tissue using a 3,3"diaminobenzidine (DAB) chromogen and Leica Bond Autostainer System. The staining intensity of the nucleus is score manually and is reported as the percentage of tumor cell nuclei demonstrating specific nuclear staining. The specimens are fixed in 10% Neutral Formalin for at least 6 hours and up to 72hrs. These tests are validated on decalcified tissue. Results should be interpreted with caution given the possibility of false negative results on decalcified specimens. Antibody Clones are as follows ER-clone 37F, PR-clone 16, Ki67- clone MM1. Some of these immunohistochemical stains may have been developed and the performance characteristics determined by [facility].   Basic metabolic panel     Status: Abnormal   Collection Time: 10/26/23  5:25 AM  Result  Value Ref Range   Sodium 137 135 - 145 mmol/L   Potassium 4.3 3.5 - 5.1 mmol/L    Comment: HEMOLYSIS AT THIS LEVEL MAY AFFECT RESULT   Chloride 105 98 - 111 mmol/L   CO2 24 22 - 32 mmol/L   Glucose, Bld 140 (H) 70 - 99 mg/dL    Comment: Glucose reference range applies only to samples taken after fasting for at least 8 hours.   BUN 13 6 - 20 mg/dL   Creatinine, Ser 5.78 0.61 - 1.24 mg/dL   Calcium 8.6 (L) 8.9 - 10.3 mg/dL   GFR, Estimated >46 >96 mL/min    Comment: (NOTE) Calculated using the CKD-EPI Creatinine Equation (2021)    Anion gap 8 5 - 15    Comment: Performed at [facility], [address] [place], [place] [address]  CBC     Status: Abnormal   Collection Time: 10/26/23  5:25 AM  Result Value Ref Range   WBC 16.0 (H) 4.0 - 10.5 K/uL   RBC 4.41 4.22 - 5.81 MIL/uL   Hemoglobin 12.2 (L) 13.0 - 17.0 g/dL   HCT 41.3 (L) 24.4 - 01.0 %   MCV 86.2 80.0 - 100.0 fL   MCH 27.7 26.0 - 34.0 pg   MCHC 32.1 30.0 - 36.0 g/dL   RDW 27.2 53.6 - 64.4 %   Platelets 212 150 - 400 K/uL   nRBC 0.0 0.0 - 0.2 %    Comment: Performed at [facility], [address], [place], [place] [address]  CBC     Status: Abnormal   Collection Time: 10/27/23  1:27 PM  Result Value Ref Range   WBC 13.1 (H) 4.0 - 10.5 K/uL   RBC 4.65 4.22 - 5.81 MIL/uL   Hemoglobin 13.1 13.0 - 17.0 g/dL   HCT 25.9 56.3 - 87.5 %   MCV 86.5 80.0 - 100.0 fL   MCH 28.2 26.0 - 34.0 pg   MCHC 32.6 30.0 - 36.0 g/dL   RDW 64.3 32.9 - 51.8 %   Platelets 198 150 - 400 K/uL   nRBC 0.0 0.0 - 0.2 %    Comment: Performed at [facility], [address], [place], [place] [address]  Creatinine, serum     Status: None   Collection Time: 10/27/23  1:27 PM  Result Value Ref Range   Creatinine, Ser 0.96 0.61 - 1.24 mg/dL   GFR, Estimated >06 >30 mL/min  Comment: (NOTE) Calculated using the CKD-EPI Creatinine Equation (2021) Performed at [facility], [address], [place], [place] [address]    Glucose, capillary     Status: None   Collection Time: 11/19/23  3:12 PM  Result Value Ref Range   Glucose-Capillary 90 70 - 99 mg/dL    Comment: Glucose reference range applies only to samples taken after fasting for at least 8 hours.      RADIOGRAPHIC STUDIES:  I have personally reviewed the radiological images as listed and agree with the findings in the report.  MR THORACIC SPINE W WO CONTRAST Result Date: 12/05/2023 CLINICAL DATA:  Follow-up neoplasm of epidural space. Bilateral leg weakness with difficulty walking. EXAM: MRI THORACIC WITHOUT AND WITH CONTRAST TECHNIQUE: Multiplanar and multiecho pulse sequences of the thoracic spine were obtained without and with intravenous contrast. CONTRAST:  10mL GADAVIST GADOBUTROL 1 MMOL/ML IV SOLN COMPARISON:  10/23/2023 FINDINGS: Alignment:  Normal Vertebrae: Widespread heterogeneity of the medullary with extensive infiltrative appearing lesions affecting essentially every level and without interval progression. No compression fracture or extraosseous tumor extension. There has been laminectomy at T4-5 to T5-6 with resolved cord compression after resection of the epidural mass. Cord: T2 hyperintensity in the cord at the level of T5 and upper T6, level of prior compression. No superimposed enhancement or residual mass. Paraspinal and other soft tissues: Expected granulation tissue in the laminectomy defect with noncompressive fluid collection usually reflecting seroma. Disc levels: No significant degenerative change. IMPRESSION: 1. 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 2. T2 hyperintensity in the cord at the level of prior surgery, likely post compressive. No residual cord mass effect. 3. Unchanged widespread marrow infiltration, correlate with pathology. Electronically Signed   By: Tiburcio Pea M.D.   On: 12/05/2023 07:09   NM PET Image Initial (PI) Skull Base To Thigh Result Date: 12/03/2023 CLINICAL DATA:  Initial  treatment strategy for intraspinal mass and diffuse bone disease. EXAM: NUCLEAR MEDICINE PET SKULL BASE TO THIGH TECHNIQUE: 11.1 mCi F-18 FDG was injected intravenously. Full-ring PET imaging was performed from the skull base to thigh after the radiotracer. CT data was obtained and used for attenuation correction and anatomic localization. Fasting blood glucose: 90 mg/dl COMPARISON:  MRI examinations of the thoracic and lumbar spines and CT scan 10/25/2023 FINDINGS: Mediastinal blood pool activity: SUV max 2.53 Liver activity: SUV max NA NECK: No hypermetabolic lymph nodes in the neck. Incidental CT findings: None. CHEST: No hypermetabolic mediastinal or hilar nodes. No suspicious pulmonary nodules on the CT scan. Incidental CT findings: None. ABDOMEN/PELVIS: No abnormal hypermetabolic activity within the liver, pancreas, adrenal glands, or spleen. No hypermetabolic lymph nodes in the abdomen or pelvis. No hypermetabolic prostate gland lesion. Incidental CT findings: Scattered atherosclerotic calcifications involving the iliac arteries. SKELETON: Postsurgical changes are noted from recent thoracic spine laminectomies and tumor resection. Associated hypermetabolism in the surgical bed. There is a diffuse mixed lytic and sclerotic process involving the thoracic and lumbar spines and bony pelvis. Some involvement also noted in the ribs and sternum. Areas of moderate hypermetabolism are noted ranging from 3.27 to a maximum 5.32. Incidental CT findings: None. IMPRESSION: 1. Postsurgical changes from recent thoracic spine laminectomies and tumor resection. Associated expected hypermetabolism in the surgical bed. 2. Diffuse mixed lytic and sclerotic osseous process with areas of moderate hypermetabolism. 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 3. No findings for extraosseous primary neoplastic process or metastatic disease. Findings likely a primary osseous process such as  lymphoma or myeloma. Electronically  Signed   By: Rudie Meyer M.D.   On: 12/03/2023 11:48    *** No pertinent imaging studies available to review.  I discussed the assessment and treatment plan with the patient. The patient was provided an opportunity to ask questions and all were answered. The patient agreed with the plan and demonstrated an understanding of the instructions. The patient was advised to call back or seek an in-person evaluation if the symptoms worsen or if the condition fails to improve as anticipated.    I spent {CHL ONC TIME VISIT - WUXLK:[id number]} for the appointment reviewing test results, discuss management and coordination of care.  Meryl Crutch, MD 12/06/2023 11:46 AM [facility] - A DEPT OF [facility] [address] [place] [address] Dept: [phone] Dept Fax: [phone]   Future Appointments  Date Time Provider Department Center  12/11/2023 11:00 AM Raedyn Klinck, Archie Patten, MD [facility] None    This document was completed utilizing speech recognition software. Grammatical errors, random word insertions, pronoun errors, and incomplete sentences are an occasional consequence of this system due to software limitations, ambient noise, and hardware issues. Any formal questions or concerns about the content, text or information contained within the body of this dictation should be directly addressed to the provider for clarification.

## 2023-12-06 NOTE — Addendum Note (Signed)
Addended by: Tillie Fantasia on: 12/06/2023 02:53 PM   Modules accepted: Orders

## 2023-12-06 NOTE — Progress Notes (Signed)
Pt with extensive osseous lesions

## 2023-12-09 ENCOUNTER — Encounter: Payer: Self-pay | Admitting: Oncology

## 2023-12-09 NOTE — Assessment & Plan Note (Signed)
Previous CT scan showed potential stricture in the colon. No current symptoms reported. -Consider colonoscopy if symptoms develop.

## 2023-12-09 NOTE — Assessment & Plan Note (Signed)
-   Please review oncology history for additional details and timeline of events and past workup.  Pathology from epidural mass/laminectomy showed overall benign findings with some atypical lymphoid infiltrate. CT scan showed no lymph node enlargement but did show a concerning area in the colon. Patient reports weakness in legs and core, but strength is improving. No bowel issues, weight loss, or other systemic symptoms reported.  PET scan on 11/19/2023 showed postsurgical changes from recent thoracic spine laminectomies and tumor resection.  Associated expected hypermetabolism in the surgical bed.  Diffuse mixed lytic and sclerotic osseous process with areas of moderate hypermetabolism with SUV max of 5.32.  Findings likely primary osseous neoplastic process such as lymphoma or myeloma.  No findings for extraosseous primary neoplastic process or metastatic disease.  -Given CT findings, PET scan and MRI findings, there is still clinical concern for metastatic malignancy of unknown primary.  Lymphomatous process could not be definitively ruled out.  Workup for monoclonal gammopathy came back negative.  ?  Nonsecretory myeloma.  -Patient's PET scan could not give [place] a definitive answer regarding a primary, we will proceed with bone marrow biopsy and include flow cytometry, myeloma FISH panel, lymphoma panel on the specimen.  Request submitted to IR for the same.   -Continue Pregabalin for neuropathic pain.  -Refill pain medication as needed.  -Follow-up with neurosurgeon, Dr. Maisie Fus.   -I will plan to see him in 4 weeks with bone marrow biopsy results.

## 2023-12-11 ENCOUNTER — Inpatient Hospital Stay: Payer: BC Managed Care – PPO | Admitting: Oncology

## 2023-12-19 ENCOUNTER — Other Ambulatory Visit: Payer: Self-pay | Admitting: Oncology

## 2023-12-19 DIAGNOSIS — C7951 Secondary malignant neoplasm of bone: Secondary | ICD-10-CM

## 2023-12-22 NOTE — Therapy (Incomplete)
OUTPATIENT PHYSICAL THERAPY THORACOLUMBAR EVALUATION   Patient Name: Charles Barrera MRN: [id number] DOB:Sep 17, 1981, 42 y.o., male Today's Date: 12/23/2023  END OF SESSION:  PT End of Session - 12/23/23 1048     Visit Number 1    Date for PT Re-Evaluation 02/17/24    Authorization Type BCBS    PT Start Time 1050    PT Stop Time 1143    PT Time Calculation (min) 53 min    Activity Tolerance Patient tolerated treatment well    Behavior During Therapy [facility] for tasks assessed/performed             History reviewed. No pertinent past medical history. Past Surgical History:  Procedure Laterality Date   FLEXIBLE SIGMOIDOSCOPY N/A 10/27/2023   Procedure: FLEXIBLE SIGMOIDOSCOPY;  Surgeon: Jeani Hawking, MD;  Location: [facility] ENDOSCOPY;  Service: Gastroenterology;  Laterality: N/A;   LAMINECTOMY N/A 10/25/2023   Procedure: Thoracic four-thoracic six laminectomy for resection of epidural mass;  Surgeon: Bedelia Person, MD;  Location: [facility] OR;  Service: Neurosurgery;  Laterality: N/A;   Patient Active Problem List   Diagnosis Date Noted   Abnormal CT scan, colon 11/08/2023   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    PCP: Caffie Damme, MD   REFERRING PROVIDER: Bedelia Person, MD   REFERRING DIAG: [phone] (ICD-10-CM) - Spondylosis with myelopathy, thoracic region  Rationale for Evaluation and Treatment: Rehabilitation  THERAPY DIAG:  Muscle weakness (generalized) - Plan: PT plan of care cert/re-cert  Unsteadiness on feet - Plan: PT plan of care cert/re-cert  Pain in thoracic spine - Plan: PT plan of care cert/re-cert  ONSET DATE: 10/25/23 DOS T4-6 laminectomy for resection of epidural mass  SUBJECTIVE:                                                                                                                                                                                           SUBJECTIVE  STATEMENT: Original symptoms in February pain in back. In September started getting leg weakness. My legs are still off balance and weak. My stomach sort of hurts since surgery like a pressure. All tests for cancer have been negative. Having bone marrow biopsy on 12/30/22. He has been using a cane since October. R leg is stronger.  PERTINENT HISTORY:  unremarkable  PAIN:  Are you having pain? Yes: NPRS scale: 0 now up to 4/10 Pain location: mid and low back Pain description: pressure Aggravating factors: prolonged standing Relieving factors: siting or changing positions  PRECAUTIONS: none  RED FLAGS: None   WEIGHT BEARING RESTRICTIONS: No  FALLS:  Has patient fallen in last 6 months? 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  DIAGNOSTIC FINDINGS:  12/05/23 MRI IMPRESSION: 1. 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 2. T2 hyperintensity in the cord at the level of prior surgery, likely post compressive. No residual cord mass effect. 3. Unchanged widespread marrow infiltration, correlate with pathology.  PATIENT SURVEYS:  Modified Oswestry 12 / 50 = 24.0 %   COGNITION: Overall cognitive status: Within functional limits for tasks assessed     SENSATION: Not full sensation back but better than prior to surgery  MUSCLE LENGTH: Quads L> R, marked HS, piriformis and gastroc B   POSTURE: rounded shoulders, forward head, and flexed trunk   PALPATION: Tender and tight in bil thoracic  paraspinals around incision  LUMBAR ROM: mild tightness with rotation B  AROM eval  Flexion   Extension   Right lateral flexion   Left lateral flexion   Right rotation   Left rotation    (Blank rows = not tested)  LOWER EXTREMITY MMT:     MMT  Right eval Left eval  Hip flexion 4+ 4  Hip extension 5 4+  Hip abduction 4 4-  Hip adduction 5 5  Hip internal rotation    Hip external rotation    Knee flexion 4 5  Knee extension 5 5  Ankle dorsiflexion 5 5  Ankle plantarflexion    Ankle inversion    Ankle eversion     (Blank rows = not tested)  LOWER EXTREMITY ROM:  WFL, but limited by flexibility deficits   FUNCTIONAL TESTS:  5 times sit to stand: 11.53 sec Berg Balance Scale: 50/56 Dynamic Gait Index: TBD  GAIT: Distance walked: 20 Assistive device utilized: Single point cane Level of assistance: Modified independence Comments: slow gait, wide BOS  TREATMENT DATE:                                                                                                                               12/23/23  See pt ed and HEP    PATIENT EDUCATION:  Education details: PT eval findings, anticipated POC, and initial HEP  Person educated: Patient Education method: Explanation, Demonstration, and Handouts Education comprehension: verbalized understanding and returned demonstration  HOME EXERCISE PROGRAM: Access Code: [email or web address] URL: [email or web address] Date: 12/23/2023 Prepared by: Raynelle Fanning  Exercises - Seated Piriformis Stretch with Trunk Bend  - 2 x daily - 7 x weekly - 1 sets - 3 reps - 15-30 hold - Sidelying  Thoracic Rotation with Open Book  - 1 x daily - 7 x weekly - 2 sets - 5 reps - Child's Pose Stretch  - 1 x daily - 7 x weekly - 1 sets - 3 reps - 20-30 sec hold - Child's Pose with Sidebending  - 2 x daily - 7 x weekly - 1 sets - 2 reps - 20-30 sec hold - Step Taps on High Step  - 1 x daily - 7 x weekly - 1 sets - 10 reps  ASSESSMENT:  CLINICAL  IMPRESSION: Patient is a 42 y.o. male who was seen today for physical therapy evaluation and treatment for thoracic spine pain and LE weakness s/p T4-6 laminectomy due to epidural mass. He also reports balance deficits and has been using a cane since October. All tests have been negative for cancer. He has a bone marrow test scheduled for early January. He demonstrates B LE weakness, marked flexibility deficits and higher level balance deficits. His BERG score of 50/56 puts him at moderate risk for falls. No gait belt required today and no incidences of LOB with gait. He will benefit from skilled PT to address these deficits.    OBJECTIVE IMPAIRMENTS: decreased activity tolerance, decreased balance, difficulty walking, decreased ROM, decreased strength, increased muscle spasms, impaired flexibility, postural dysfunction, and pain.   ACTIVITY LIMITATIONS: carrying, lifting, bending, standing, stairs, and locomotion level  PARTICIPATION LIMITATIONS: cleaning, shopping, community activity, occupation, and yard work  PERSONAL FACTORS: Fitness, Profession, and 1 comorbidity: myelopathy  are also affecting patient's functional outcome.   REHAB POTENTIAL: Good  CLINICAL DECISION MAKING: Evolving/moderate complexity  EVALUATION COMPLEXITY: Moderate   GOALS: Goals reviewed with patient? Yes  SHORT TERM GOALS: Target date: 01/13/2024   Patient will be independent with initial HEP. Baseline:  Goal status: INITIAL  2. DGI completed to assess fall risk. Baseline:  Goal status: INITIAL   LONG TERM GOALS: Target date: 02/17/2024  Patient will be independent with advanced/ongoing HEP to improve outcomes and carryover.  Baseline:  Goal status: INITIAL  2.  Patient will be able to ambulate 600' in 6 minutes without AD with good safety to access community.  Baseline:  Goal status: INITIAL  3.  Patient will be able to step up/down curb safely without AD with good safety.  Baseline:  Goal  status: INITIAL   4.  Patient will demonstrate decreased 5XSTS by 2-3 seconds showing improved functional strength. Baseline: 11.53 sec Goal status: INITIAL  5.  Patient will demonstrate at least 19/24 on DGI to improve gait stability and reduce risk for falls. Baseline: TBD Goal status: INITIAL  6.  Patient will score 56 on Berg Balance test to demonstrate lower risk of falls. (MCID= 8 points) .  Baseline: 50 Goal status: INITIAL  7.  Patient will report 6 on Modified Oswestry to demonstrate improved functional ability. Baseline: 12 / 50 = 24.0 % Goal status: INITIAL  8.  Patient will report decreased back pain by 75% to improved QOL. Baseline:  Goal status: INITIAL    PLAN:  PT FREQUENCY: 2x/week  PT DURATION: 8 weeks  PLANNED INTERVENTIONS: 97164- PT Re-evaluation, 97110-Therapeutic exercises, 97530- Therapeutic activity, 97112- Neuromuscular re-education, 97535- Self Care, 42706- Manual therapy, [phone]- Gait training, [phone]- Aquatic Therapy, [phone]- Electrical stimulation (unattended), Patient/Family education, Balance training, Stair training, Taping, Dry Needling, Joint mobilization, Spinal mobilization, Scar mobilization, Cryotherapy, and Moist heat.  PLAN FOR NEXT SESSION: Complete DGI, work on higher level balance, functional LE strength, MT prn for thoracic paraspinals.  Solon Palm, PT  12/23/2023, 4:12 PM

## 2023-12-23 ENCOUNTER — Ambulatory Visit: Payer: BC Managed Care – PPO | Attending: Neurosurgery | Admitting: Physical Therapy

## 2023-12-23 ENCOUNTER — Other Ambulatory Visit: Payer: Self-pay

## 2023-12-23 ENCOUNTER — Inpatient Hospital Stay: Payer: BC Managed Care – PPO

## 2023-12-23 ENCOUNTER — Encounter: Payer: Self-pay | Admitting: Physical Therapy

## 2023-12-23 DIAGNOSIS — M546 Pain in thoracic spine: Secondary | ICD-10-CM | POA: Diagnosis present

## 2023-12-23 DIAGNOSIS — M6281 Muscle weakness (generalized): Secondary | ICD-10-CM | POA: Insufficient documentation

## 2023-12-23 DIAGNOSIS — R2681 Unsteadiness on feet: Secondary | ICD-10-CM | POA: Insufficient documentation

## 2023-12-24 NOTE — Therapy (Signed)
 OUTPATIENT PHYSICAL THERAPY THORACOLUMBAR TREATMENT   Patient Name: Charles Barrera MRN: [id number] DOB:October 03, 1981, 42 y.o., male Today's Date: 12/26/2023  END OF SESSION:  PT End of Session - 12/26/23 1450     Visit Number 2    Date for PT Re-Evaluation 02/17/24    Authorization Type BCBS    PT Start Time 1450    PT Stop Time 1535    PT Time Calculation (min) 45 min    Activity Tolerance Patient tolerated treatment well    Behavior During Therapy [facility] for tasks assessed/performed              History reviewed. No pertinent past medical history. Past Surgical History:  Procedure Laterality Date   FLEXIBLE SIGMOIDOSCOPY N/A 10/27/2023   Procedure: FLEXIBLE SIGMOIDOSCOPY;  Surgeon: Rollin Dover, MD;  Location: [facility] ENDOSCOPY;  Service: Gastroenterology;  Laterality: N/A;   LAMINECTOMY N/A 10/25/2023   Procedure: Thoracic four-thoracic six laminectomy for resection of epidural mass;  Surgeon: Debby Dorn MATSU, MD;  Location: [facility] OR;  Service: Neurosurgery;  Laterality: N/A;   Patient Active Problem List   Diagnosis Date Noted   Abnormal CT scan, colon 11/08/2023   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    PCP: Claudene Round, MD   REFERRING PROVIDER: Debby Dorn MATSU, MD   REFERRING DIAG: [phone] (ICD-10-CM) - Spondylosis with myelopathy, thoracic region  Rationale for Evaluation and Treatment: Rehabilitation  THERAPY DIAG:  Muscle weakness (generalized)  Unsteadiness on feet  Pain in thoracic spine  ONSET DATE: 10/25/23 DOS T4-6 laminectomy for resection of epidural mass  SUBJECTIVE:                                                                                                                                                                                           SUBJECTIVE STATEMENT: I did some of the exercises.   PERTINENT HISTORY:  unremarkable  PAIN:  Are you having pain? Yes: NPRS scale:  0 now up to 4/10 Pain location: mid and low back Pain description: pressure Aggravating factors: prolonged standing Relieving factors: siting or changing positions  PRECAUTIONS: none  RED FLAGS: None   WEIGHT BEARING RESTRICTIONS: No  FALLS:  Has patient fallen in last 6 months? 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  DIAGNOSTIC FINDINGS:  12/05/23 MRI IMPRESSION: 1. 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 2. T2 hyperintensity in the cord at the level of prior surgery, likely post compressive. No residual cord mass effect. 3. Unchanged widespread marrow infiltration, correlate with pathology.  PATIENT SURVEYS:  Modified Oswestry 12 / 50 = 24.0 %   COGNITION: Overall cognitive status: Within functional limits for tasks assessed     SENSATION: Not full sensation back but better than prior to surgery  MUSCLE LENGTH: Quads L> R, marked HS, piriformis and gastroc B   POSTURE: rounded shoulders, forward head, and flexed trunk   PALPATION: Tender and tight in bil thoracic paraspinals around incision  LUMBAR ROM: mild tightness with rotation B  AROM eval  Flexion   Extension   Right lateral flexion   Left lateral flexion   Right rotation   Left rotation    (Blank rows = not tested)  LOWER EXTREMITY MMT:     MMT  Right eval Left eval  Hip flexion 4+ 4  Hip extension 5 4+  Hip abduction 4 4-  Hip adduction 5 5  Hip internal rotation    Hip external rotation    Knee  flexion 4 5  Knee extension 5 5  Ankle dorsiflexion 5 5  Ankle plantarflexion    Ankle inversion    Ankle eversion     (Blank rows = not tested)  LOWER EXTREMITY ROM:  WFL, but limited by flexibility deficits   FUNCTIONAL TESTS:  5 times sit to stand: 11.53 sec Berg Balance Scale: 50/56 Dynamic Gait Index: 22/24 on 12/26/23  GAIT: Distance walked: 20 Assistive device utilized: Single point cane Level of assistance: Modified independence Comments: slow gait, wide BOS  TREATMENT DATE:                                                                                                                               12/26/23 DGI completed Standing foot taps to 8 inch step x 10 B SBA Step up with alt knee raise x 10 ea with UE support Standing march 2 x 10 ea (harder than step up with knee raise) Standing hip ABD 2x10 Standing hip ext x 10 ea Leg press 70# B x 15, unilateral 40# x 15 ea Sit to stand black band around thighs x 10 Sidestepping 4 x 10 black band  Monster walk 2 x 10 black band  Thoracic extension in chair x 10   12/23/23  See pt ed and HEP    PATIENT EDUCATION:  Education details: PT eval findings, anticipated POC, and initial HEP  Person educated: Patient Education method: Explanation, Demonstration, and Handouts Education comprehension: verbalized understanding and returned demonstration  HOME EXERCISE PROGRAM: Access Code: [email or web address] URL: [email or web address] Date: 12/26/2023 Prepared by: Mliss  Exercises - Seated  Piriformis Stretch with Trunk Bend  - 2 x daily - 7 x weekly - 1 sets - 3 reps - 15-30 hold - Sidelying Thoracic Rotation with Open Book  - 1 x daily - 7 x weekly - 2 sets - 5 reps - Child's Pose Stretch  - 1 x daily - 7 x weekly - 1 sets - 3 reps - 20-30 sec hold - Child's Pose with Sidebending  - 2 x daily - 7 x weekly - 1 sets - 2 reps - 20-30 sec hold - Step Taps on High Step  - 1 x daily - 7 x weekly - 1 sets - 10 reps - Side  Stepping with Resistance at Thighs  - 1 x daily - 3-4 x weekly - 2 sets - 10 reps - Forward Monster Walk with Resistance (BKA)  - 1 x daily - 3-4 x weekly - 2 sets - 10 reps - Standing Marching  - 1 x daily - 3-4 x weekly - 2-3 sets - 10 reps - Standing Hip Extension with Counter Support  - 1 x daily - 3-4 x weekly - 2-3 sets - 10 reps - Standing Hip Abduction with Counter Support  - 1 x daily - 3-4 x weekly - 2-3 sets - 10 reps  ASSESSMENT:  CLINICAL IMPRESSION: Charles Barrera scored 22/24 on the DGI and demonstrated no difficulty except a slight deviation with looking up. He demonstrates difficulty with SL stance exercises particularly when standing on the LLE. Step taps continue to be challenging at 8 inch height. He did well with resisted band exercises with resistance at thighs. HEP progressed.  Eval: Patient is a 42 y.o. male who was seen today for physical therapy evaluation and treatment for thoracic spine pain and LE weakness s/p T4-6 laminectomy due to epidural mass. He also reports balance deficits and has been using a cane since October. All tests have been negative for cancer. He has a bone marrow test scheduled for early January. He demonstrates B LE weakness, marked flexibility deficits and higher level balance deficits. His BERG score of 50/56 puts him at moderate risk for falls. No gait belt required today and no incidences of LOB with gait. He will benefit from skilled PT to address these deficits.    OBJECTIVE IMPAIRMENTS: decreased activity tolerance, decreased balance, difficulty walking, decreased ROM, decreased strength, increased muscle spasms, impaired flexibility, postural dysfunction, and pain.   ACTIVITY LIMITATIONS: carrying, lifting, bending, standing, stairs, and locomotion level  PARTICIPATION LIMITATIONS: cleaning, shopping, community activity, occupation, and yard work  PERSONAL FACTORS: Fitness, Profession, and 1 comorbidity: myelopathy  are also affecting patient's  functional outcome.   REHAB POTENTIAL: Good  CLINICAL DECISION MAKING: Evolving/moderate complexity  EVALUATION COMPLEXITY: Moderate   GOALS: Goals reviewed with patient? Yes  SHORT TERM GOALS: Target date: 01/13/2024   Patient will be independent with initial HEP. Baseline:  Goal status: INITIAL  2. DGI completed to assess fall risk. Baseline:  Goal status: MET   LONG TERM GOALS: Target date: 02/17/2024  Patient will be independent with advanced/ongoing HEP to improve outcomes and carryover.  Baseline:  Goal status: INITIAL  2.  Patient will be able to ambulate 600' in 6 minutes without AD with good safety to access community.  Baseline:  Goal status: INITIAL  3.  Patient will be able to step up/down curb safely without AD with good safety.  Baseline:  Goal status: INITIAL   4.  Patient will demonstrate decreased 5XSTS by 2-3 seconds showing improved  functional strength. Baseline: 11.53 sec Goal status: INITIAL  5.  Patient will demonstrate at least 19/24 on DGI to improve gait stability and reduce risk for falls. Baseline: TBD Goal status: MET  6.  Patient will score 56 on Berg Balance test to demonstrate lower risk of falls. (MCID= 8 points) .  Baseline: 50 Goal status: INITIAL  7.  Patient will report 6 on Modified Oswestry to demonstrate improved functional ability. Baseline: 12 / 50 = 24.0 % Goal status: INITIAL  8.  Patient will report decreased back pain by 75% to improved QOL. Baseline:  Goal status: INITIAL    PLAN:  PT FREQUENCY: 2x/week  PT DURATION: 8 weeks  PLANNED INTERVENTIONS: 97164- PT Re-evaluation, 97110-Therapeutic exercises, 97530- Therapeutic activity, 97112- Neuromuscular re-education, 97535- Self Care, [id number]- Manual therapy, [phone]- Gait training, [phone]- Aquatic Therapy, [phone]- Electrical stimulation (unattended), Patient/Family education, Balance training, Stair training, Taping, Dry Needling, Joint mobilization, Spinal  mobilization, Scar mobilization, Cryotherapy, and Moist heat.  PLAN FOR NEXT SESSION: Work on LE strength, MT prn for thoracic paraspinals.   Mliss Cummins, PT  12/26/2023, 4:53 PM

## 2023-12-26 ENCOUNTER — Encounter: Payer: Self-pay | Admitting: Physical Therapy

## 2023-12-26 ENCOUNTER — Ambulatory Visit: Payer: 59 | Attending: Neurosurgery | Admitting: Physical Therapy

## 2023-12-26 DIAGNOSIS — M6281 Muscle weakness (generalized): Secondary | ICD-10-CM | POA: Diagnosis present

## 2023-12-26 DIAGNOSIS — R2681 Unsteadiness on feet: Secondary | ICD-10-CM | POA: Insufficient documentation

## 2023-12-26 DIAGNOSIS — M546 Pain in thoracic spine: Secondary | ICD-10-CM | POA: Diagnosis present

## 2023-12-29 ENCOUNTER — Other Ambulatory Visit: Payer: Self-pay | Admitting: Radiology

## 2023-12-29 DIAGNOSIS — C7951 Secondary malignant neoplasm of bone: Secondary | ICD-10-CM

## 2023-12-29 NOTE — Consult Note (Signed)
 Chief Complaint: Patient was seen in consultation today for CT guided bone marrow biopsy  Referring Physician(s): Pasam,Avinash  Supervising Physician: Hughes Simmonds  Patient Status: [facility] - Out-pt  History of Present Illness: Charles Barrera is a 43 y.o. male with no significant past medical history who presented to [facility] on 10/23/23 for progressive weakness and walking difficulty. He began having mid back pain in February, but in September 2024, began having difficulty with leg weakness and walking. He had workup at [facility] and with Dr. Marlyce ortho spine and numerous bone lesions were seen in his lumbar spine 10/5 . MRI of the thoracic and lumbar spine was concerning for metastatic disease involving nearly all vertebral bodies, sacrum, and iliac bones with extraosseous mass extending from T4-T6 and displacing the spinal cord. On 10/25/2023, Dr. Dorn Ned, neurosurgery performed thoracic 4, thoracic 5 and thoracic 6 laminectomy, right medial facetectomies, resection of epidural mass T4-5-6. 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 T5 nerve root was enveloped with tumor, small residual tumor left around nerve root and thin layer in right ventral epidural space. PET scan on 12/03/23 revealed: 1. Postsurgical changes from recent thoracic spine laminectomies and tumor resection. Associated expected hypermetabolism in the surgical bed. 2. Diffuse mixed lytic and sclerotic osseous process with areas of moderate hypermetabolism. 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 3. No findings for extraosseous primary neoplastic process or metastatic disease. Findings likely a primary osseous process such as lymphoma or myeloma.  He presents today for CT guided bone marrow biopsy for further evaluation.    No past medical history on file.  Past Surgical History:  Procedure Laterality Date   FLEXIBLE SIGMOIDOSCOPY N/A 10/27/2023    Procedure: FLEXIBLE SIGMOIDOSCOPY;  Surgeon: Rollin Dover, MD;  Location: [facility] ENDOSCOPY;  Service: Gastroenterology;  Laterality: N/A;   LAMINECTOMY N/A 10/25/2023   Procedure: Thoracic four-thoracic six laminectomy for resection of epidural mass;  Surgeon: Ned Dorn MATSU, MD;  Location: [facility] OR;  Service: Neurosurgery;  Laterality: N/A;    Allergies: Patient has no known allergies.  Medications: Prior to Admission medications   Medication Sig Start Date End Date Taking? Authorizing Provider  oxyCODONE  (OXY IR/ROXICODONE ) 5 MG immediate release tablet Take 1 tablet (5 mg total) by mouth every 6 (six) hours as needed for moderate pain (pain score 4-6). 11/07/23   Pasam, Chinita, MD  pregabalin  (LYRICA ) 150 MG capsule Take 150 mg by mouth 2 (two) times daily. 10/01/23   [provider]     No family history on file.  Social History   Socioeconomic History   Marital status: Married    Spouse name: Not on file   Number of children: Not on file   Years of education: Not on file   Highest education level: Not on file  Occupational History   Not on file  Tobacco Use   Smoking status: Never   Smokeless tobacco: Never  Vaping Use   Vaping status: Never Used  Substance and Sexual Activity   Alcohol use: Yes   Drug use: Not Currently   Sexual activity: Not on file  Other Topics Concern   Not on file  Social History Narrative   Not on file   Social Drivers of Health   Financial Resource Strain: Not on file  Food Insecurity: No Food Insecurity (10/23/2023)   Hunger Vital Sign    Worried About Running Out of Food in the Last Year: Never true    Ran Out of Food  in the Last Year: Never true  Transportation Needs: No Transportation Needs (10/23/2023)   PRAPARE - Administrator, Civil Service (Medical): No    Lack of Transportation (Non-Medical): No  Physical Activity: Not on file  Stress: Not on file  Social Connections: Unknown (05/08/2022)   Received from  [facility]   Social Network    Social Network: Not on file     Review of Systems: denies fever,HA,CP,dyspnea, cough, N/V or bleeding; he does have fatigue, LE weakness/numbness, occ abd/back discomfort  Vital Signs: Vitals:   12/31/23 0729  Temp: 99.1 F (37.3 C)      Code Status: FULL CODE    Physical Exam: awake/alert; chest- CTA bilat; heart- RRR; abd-soft,+BS, some mild upper abd tenderness to palpation; no LE edema  Imaging: No results found.  Labs:  CBC: Recent Labs    10/24/23 0728 10/25/23 0506 10/26/23 0525 10/27/23 1327  WBC 5.3 10.6* 16.0* 13.1*  HGB 14.2 14.9 12.2* 13.1  HCT 44.1 45.1 38.0* 40.2  PLT 202 235 212 198    COAGS: No results for input(s): INR, APTT in the last 8760 hours.  BMP: Recent Labs    10/23/23 1434 10/24/23 0728 10/25/23 0506 10/26/23 0525 10/27/23 1327  NA 139 140 139 137  --   K 3.5 3.7 3.8 4.3  --   CL 101 105 107 105  --   CO2 29 25 24 24   --   GLUCOSE 91 108* 180* 140*  --   BUN 10 9 12 13   --   CALCIUM 9.1 9.1 9.3 8.6*  --   CREATININE 1.08 1.05 0.95 0.97 0.96  GFRNONAA >60 >60 >60 >60 >60    LIVER FUNCTION TESTS: No results for input(s): BILITOT, AST, ALT, ALKPHOS, PROT, ALBUMIN  in the last 8760 hours.  TUMOR MARKERS: No results for input(s): AFPTM, CEA, CA199, CHROMGRNA in the last 8760 hours.  Assessment and Plan: 43 y.o. male with no significant past medical history who presented to [facility] on 10/23/23 for progressive weakness and walking difficulty. He began having mid back pain in February, but in September 2024, began having difficulty with leg weakness and walking. He had workup at [facility] and with Dr. Marlyce ortho spine and numerous bone lesions were seen in his lumbar spine 10/5 . MRI of the thoracic and lumbar spine was concerning for metastatic disease involving nearly all vertebral bodies, sacrum, and iliac bones with extraosseous mass extending  from T4-T6 and displacing the spinal cord. On 10/25/2023, Dr. Dorn Ned, neurosurgery performed thoracic 4, thoracic 5 and thoracic 6 laminectomy, right medial facetectomies, resection of epidural mass T4-5-6. 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 T5 nerve root was enveloped with tumor, small residual tumor left around nerve root and thin layer in right ventral epidural space. PET scan on 12/03/23 revealed: 1. Postsurgical changes from recent thoracic spine laminectomies and tumor resection. Associated expected hypermetabolism in the surgical bed. 2. Diffuse mixed lytic and sclerotic osseous process with areas of moderate hypermetabolism. 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 3. No findings for extraosseous primary neoplastic process or metastatic disease. Findings likely a primary osseous process such as lymphoma or myeloma.  He presents today for CT guided bone marrow biopsy for further evaluation. Risks and benefits of procedure was discussed with the patient  including, but not limited to bleeding, infection, damage to adjacent structures or low yield requiring additional tests.  All of the questions were answered and  there is agreement to proceed.  Consent signed and in chart.    Thank you for this interesting consult.  I greatly enjoyed meeting Charles Barrera and look forward to participating in their care.  A copy of this report was sent to the requesting provider on this date.  Electronically Signed: D. Franky Rakers, PA-C 12/29/2023, 5:28 PM   I spent a total of 20 minutes    in face to face in clinical consultation, greater than 50% of which was counseling/coordinating care for CT guided bone marrow biopsy

## 2023-12-30 ENCOUNTER — Ambulatory Visit: Payer: 59 | Admitting: Physical Therapy

## 2023-12-30 ENCOUNTER — Encounter: Payer: Self-pay | Admitting: Physical Therapy

## 2023-12-30 ENCOUNTER — Other Ambulatory Visit: Payer: Self-pay | Admitting: Urology

## 2023-12-30 DIAGNOSIS — M6281 Muscle weakness (generalized): Secondary | ICD-10-CM | POA: Diagnosis not present

## 2023-12-30 DIAGNOSIS — M546 Pain in thoracic spine: Secondary | ICD-10-CM

## 2023-12-30 DIAGNOSIS — R2681 Unsteadiness on feet: Secondary | ICD-10-CM

## 2023-12-30 NOTE — Therapy (Signed)
 OUTPATIENT PHYSICAL THERAPY THORACOLUMBAR TREATMENT   Patient Name: Charles Barrera MRN: [id number] DOB:06/06/1981, 43 y.o., male Today's Date: 12/30/2023  END OF SESSION:  PT End of Session - 12/30/23 1455     Visit Number 3    Date for PT Re-Evaluation 02/17/24    Authorization Type BCBS    PT Start Time 1455   late check in   PT Stop Time 1529    PT Time Calculation (min) 34 min    Activity Tolerance Patient tolerated treatment well               History reviewed. No pertinent past medical history. Past Surgical History:  Procedure Laterality Date   FLEXIBLE SIGMOIDOSCOPY N/A 10/27/2023   Procedure: FLEXIBLE SIGMOIDOSCOPY;  Surgeon: Rollin Dover, MD;  Location: [facility] ENDOSCOPY;  Service: Gastroenterology;  Laterality: N/A;   LAMINECTOMY N/A 10/25/2023   Procedure: Thoracic four-thoracic six laminectomy for resection of epidural mass;  Surgeon: Debby Dorn MATSU, MD;  Location: [facility] OR;  Service: Neurosurgery;  Laterality: N/A;   Patient Active Problem List   Diagnosis Date Noted   Abnormal CT scan, colon 11/08/2023   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    PCP: Claudene Round, MD   REFERRING PROVIDER: Debby Dorn MATSU, MD   REFERRING DIAG: [phone] (ICD-10-CM) - Spondylosis with myelopathy, thoracic region  Rationale for Evaluation and Treatment: Rehabilitation  THERAPY DIAG:  Muscle weakness (generalized)  Unsteadiness on feet  Pain in thoracic spine  ONSET DATE: 10/25/23 DOS T4-6 laminectomy for resection of epidural mass  SUBJECTIVE:                                                                                                                                                                                           SUBJECTIVE STATEMENT: Felt some fatigue after last session but no increase in pain. Having a bit of thoracic pain today which he attributes to sitting at work. No other new updates,  HEP. States he's got a bone biopsy tomorrow   PERTINENT HISTORY:  unremarkable  PAIN:  Are you having pain? Yes: NPRS scale: 1/10  Pain location: mid and low back Pain description: pressure Aggravating factors: prolonged standing Relieving factors: siting or changing positions  PRECAUTIONS: none  RED FLAGS: None   WEIGHT BEARING RESTRICTIONS: No  FALLS:  Has patient fallen in last 6 months? 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  DIAGNOSTIC FINDINGS:  12/05/23 MRI IMPRESSION: 1. 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 2. T2 hyperintensity in the cord at the level of prior surgery, likely post compressive. No residual cord mass effect. 3. Unchanged widespread marrow infiltration, correlate with pathology.  PATIENT SURVEYS:  Modified Oswestry 12 / 50 = 24.0 %   COGNITION: Overall cognitive status: Within functional limits for tasks assessed     SENSATION: Not full sensation back but better than prior to surgery  MUSCLE LENGTH: Quads L> R, marked HS, piriformis and gastroc B   POSTURE: rounded shoulders, forward head, and flexed trunk   PALPATION: Tender and tight in bil thoracic paraspinals around incision  LUMBAR ROM: mild tightness with rotation B  AROM eval  Flexion   Extension   Right lateral flexion   Left lateral flexion   Right rotation   Left rotation    (Blank rows = not tested)  LOWER EXTREMITY MMT:     MMT  Right eval Left eval  Hip flexion 4+ 4   Hip extension 5 4+  Hip abduction 4 4-  Hip adduction 5 5  Hip internal rotation    Hip external rotation    Knee flexion 4 5  Knee extension 5 5  Ankle dorsiflexion 5 5  Ankle plantarflexion    Ankle inversion    Ankle eversion     (Blank rows = not tested)  LOWER EXTREMITY ROM:  WFL, but limited by flexibility deficits   FUNCTIONAL TESTS:  5 times sit to stand: 11.53 sec Berg Balance Scale: 50/56 Dynamic Gait Index: 22/24 on 12/26/23  GAIT: Distance walked: 20 Assistive device utilized: Single point cane Level of assistance: Modified independence Comments: slow gait, wide BOS  TREATMENT DATE:                                                                                                                               [facility] Adult PT Treatment:                                                DATE: 12/30/23 Therapeutic Exercise: 8 inch step taps no UE support 2x10 BIL Red band hip abd 2x8 BIL cues for posture Red band hip ext 2x8 BIL cues for posture Red band hip 45 deg kickback 2x5 STS from chair x8 no UE support     12/26/23 DGI completed Standing foot taps to 8 inch step x 10 B SBA Step up with alt knee raise x 10 ea with UE support Standing  march 2 x 10 ea (harder than step up with knee raise) Standing hip ABD 2x10 Standing hip ext x 10 ea Leg press 70# B x 15, unilateral 40# x 15 ea Sit to stand black band around thighs x 10 Sidestepping 4 x 10 black band  Monster walk 2 x 10 black band  Thoracic extension in chair x 10   12/23/23  See pt ed and HEP    PATIENT EDUCATION:  Education details: rationale for interventions, HEP  Person educated: Patient Education method: Explanation, Demonstration, Tactile cues, Verbal cues Education comprehension: verbalized understanding, returned demonstration, verbal cues required, tactile cues required, and needs further education     HOME EXERCISE PROGRAM: Access Code: [email or web address] URL:  [email or web address] Date: 12/26/2023 Prepared by: Mliss  Exercises - Seated Piriformis Stretch with Trunk Bend  - 2 x daily - 7 x weekly - 1 sets - 3 reps - 15-30 hold - Sidelying Thoracic Rotation with Open Book  - 1 x daily - 7 x weekly - 2 sets - 5 reps - Child's Pose Stretch  - 1 x daily - 7 x weekly - 1 sets - 3 reps - 20-30 sec hold - Child's Pose with Sidebending  - 2 x daily - 7 x weekly - 1 sets - 2 reps - 20-30 sec hold - Step Taps on High Step  - 1 x daily - 7 x weekly - 1 sets - 10 reps - Side Stepping with Resistance at Thighs  - 1 x daily - 3-4 x weekly - 2 sets - 10 reps - Forward Monster Walk with Resistance (BKA)  - 1 x daily - 3-4 x weekly - 2 sets - 10 reps - Standing Marching  - 1 x daily - 3-4 x weekly - 2-3 sets - 10 reps - Standing Hip Extension with Counter Support  - 1 x daily - 3-4 x weekly - 2-3 sets - 10 reps - Standing Hip Abduction with Counter Support  - 1 x daily - 3-4 x weekly - 2-3 sets - 10 reps  ASSESSMENT:  CLINICAL IMPRESSION: 12/30/2023 Pt arrives w/ mild mid back pain, report of fatigue after PT sessions. Today continuing to work on LE strength, able to progress with resistance for hip work and continuing with STS. Pt primarily limited by muscular fatigue which is mitigated by rest breaks. No adverse events, tolerates session well overall. Pt notes that he is getting a bone biopsy tomorrow - encouraged to clarify if any restrictions afterwards and if clear to proceed with PT visit on Thursday. Recommend continuing along current POC in order to address relevant deficits and improve functional tolerance. Pt departs today's session in no acute distress, all voiced questions/concerns addressed appropriately from PT perspective.    Eval: Patient is a 43 y.o. male who was seen today for physical therapy evaluation and treatment for thoracic spine pain and LE weakness s/p T4-6 laminectomy due to epidural mass. He also reports balance deficits and  has been using a cane since October. All tests have been negative for cancer. He has a bone marrow test scheduled for early January. He demonstrates B LE weakness, marked flexibility deficits and higher level balance deficits. His BERG score of 50/56 puts him at moderate risk for falls. No gait belt required today and no incidences of LOB with gait. He will benefit from skilled PT to address these deficits.    OBJECTIVE IMPAIRMENTS: decreased activity tolerance, decreased balance, difficulty walking, decreased ROM, decreased strength, increased muscle spasms,  impaired flexibility, postural dysfunction, and pain.   ACTIVITY LIMITATIONS: carrying, lifting, bending, standing, stairs, and locomotion level  PARTICIPATION LIMITATIONS: cleaning, shopping, community activity, occupation, and yard work  PERSONAL FACTORS: Fitness, Profession, and 1 comorbidity: myelopathy  are also affecting patient's functional outcome.   REHAB POTENTIAL: Good  CLINICAL DECISION MAKING: Evolving/moderate complexity  EVALUATION COMPLEXITY: Moderate   GOALS: Goals reviewed with patient? Yes  SHORT TERM GOALS: Target date: 01/13/2024   Patient will be independent with initial HEP. Baseline:  Goal status: INITIAL  2. DGI completed to assess fall risk. Baseline:  Goal status: MET   LONG TERM GOALS: Target date: 02/17/2024  Patient will be independent with advanced/ongoing HEP to improve outcomes and carryover.  Baseline:  Goal status: INITIAL  2.  Patient will be able to ambulate 600' in 6 minutes without AD with good safety to access community.  Baseline:  Goal status: INITIAL  3.  Patient will be able to step up/down curb safely without AD with good safety.  Baseline:  Goal status: INITIAL   4.  Patient will demonstrate decreased 5XSTS by 2-3 seconds showing improved functional strength. Baseline: 11.53 sec Goal status: INITIAL  5.  Patient will demonstrate at least 19/24 on DGI to improve gait  stability and reduce risk for falls. Baseline: TBD Goal status: MET  6.  Patient will score 56 on Berg Balance test to demonstrate lower risk of falls. (MCID= 8 points) .  Baseline: 50 Goal status: INITIAL  7.  Patient will report 6 on Modified Oswestry to demonstrate improved functional ability. Baseline: 12 / 50 = 24.0 % Goal status: INITIAL  8.  Patient will report decreased back pain by 75% to improved QOL. Baseline:  Goal status: INITIAL    PLAN:  PT FREQUENCY: 2x/week  PT DURATION: 8 weeks  PLANNED INTERVENTIONS: 97164- PT Re-evaluation, 97110-Therapeutic exercises, 97530- Therapeutic activity, 97112- Neuromuscular re-education, 97535- Self Care, [id number]- Manual therapy, [phone]- Gait training, [phone]- Aquatic Therapy, [phone]- Electrical stimulation (unattended), Patient/Family education, Balance training, Stair training, Taping, Dry Needling, Joint mobilization, Spinal mobilization, Scar mobilization, Cryotherapy, and Moist heat.  PLAN FOR NEXT SESSION: Work on LE strength, MT prn for thoracic paraspinals.   Alm DELENA Jenny PT, DPT 12/30/2023 3:38 PM

## 2023-12-31 ENCOUNTER — Ambulatory Visit (HOSPITAL_COMMUNITY)
Admission: RE | Admit: 2023-12-31 | Discharge: 2023-12-31 | Disposition: A | Discharge status: Home / Self Care | Payer: 59 | Source: Ambulatory Visit | Attending: Oncology | Admitting: Oncology

## 2023-12-31 ENCOUNTER — Encounter (HOSPITAL_COMMUNITY): Payer: Self-pay

## 2023-12-31 ENCOUNTER — Ambulatory Visit (HOSPITAL_COMMUNITY)
Admission: RE | Admit: 2023-12-31 | Discharge: 2023-12-31 | Disposition: A | Discharge status: Home / Self Care | Payer: 59 | Source: Ambulatory Visit | Attending: Oncology

## 2023-12-31 ENCOUNTER — Other Ambulatory Visit: Payer: Self-pay

## 2023-12-31 DIAGNOSIS — C7951 Secondary malignant neoplasm of bone: Secondary | ICD-10-CM | POA: Insufficient documentation

## 2023-12-31 DIAGNOSIS — C801 Malignant (primary) neoplasm, unspecified: Secondary | ICD-10-CM | POA: Insufficient documentation

## 2023-12-31 LAB — CBC WITH DIFFERENTIAL/PLATELET
Abs Immature Granulocytes: 0.01 10*3/uL (ref 0.00–0.07)
Basophils Absolute: 0 10*3/uL (ref 0.0–0.1)
Basophils Relative: 1 %
Eosinophils Absolute: 0.1 10*3/uL (ref 0.0–0.5)
Eosinophils Relative: 1 %
HCT: 42.1 % (ref 39.0–52.0)
Hemoglobin: 13.5 g/dL (ref 13.0–17.0)
Immature Granulocytes: 0 %
Lymphocytes Relative: 34 %
Lymphs Abs: 1.7 10*3/uL (ref 0.7–4.0)
MCH: 28.4 pg (ref 26.0–34.0)
MCHC: 32.1 g/dL (ref 30.0–36.0)
MCV: 88.4 fL (ref 80.0–100.0)
Monocytes Absolute: 0.5 10*3/uL (ref 0.1–1.0)
Monocytes Relative: 11 %
Neutro Abs: 2.6 10*3/uL (ref 1.7–7.7)
Neutrophils Relative %: 53 %
Platelets: 193 10*3/uL (ref 150–400)
RBC: 4.76 MIL/uL (ref 4.22–5.81)
RDW: 13.2 % (ref 11.5–15.5)
WBC: 4.9 10*3/uL (ref 4.0–10.5)
nRBC: 0 % (ref 0.0–0.2)

## 2023-12-31 MED ORDER — SODIUM CHLORIDE 0.9 % IV SOLN: INTRAVENOUS | Status: DC

## 2023-12-31 MED ORDER — MIDAZOLAM HCL 2 MG/2ML IJ SOLN
INTRAMUSCULAR | Status: AC | PRN
  Administered 2023-12-31 (×2): 2 mg via INTRAVENOUS

## 2023-12-31 MED ORDER — DIPHENHYDRAMINE HCL 50 MG/ML IJ SOLN
INTRAMUSCULAR | Status: AC | PRN
  Administered 2023-12-31: 25 mg via INTRAVENOUS

## 2023-12-31 MED ORDER — FENTANYL CITRATE (PF) 100 MCG/2ML IJ SOLN
INTRAMUSCULAR | Status: AC | PRN
  Administered 2023-12-31 (×2): 50 ug via INTRAVENOUS

## 2023-12-31 MED ORDER — FENTANYL CITRATE (PF) 100 MCG/2ML IJ SOLN
INTRAMUSCULAR | Status: AC
  Filled 2023-12-31: qty 4

## 2023-12-31 MED ORDER — DIPHENHYDRAMINE HCL 50 MG/ML IJ SOLN
INTRAMUSCULAR | Status: AC
Start: 2023-12-31 — End: ?
  Filled 2023-12-31: qty 1

## 2023-12-31 MED ORDER — MIDAZOLAM HCL 2 MG/2ML IJ SOLN
INTRAMUSCULAR | Status: AC
Start: 2023-12-31 — End: ?
  Filled 2023-12-31: qty 6

## 2023-12-31 NOTE — Procedures (Signed)
 Vascular and Interventional Radiology Procedure Note  Patient: Esaiah Wanless DOB: 1980-12-26 Medical Record Number: [id number] Note Date/Time: 12/31/23 9:11 AM   Performing Physician: Thom Hall, MD Assistant(s): None  Diagnosis: Q lymphoma vs myeloma   Procedure: BONE MARROW ASPIRATION and BIOPSY  Anesthesia: Conscious Sedation Complications: None Estimated Blood Loss: Minimal Specimens: Sent for Pathology  Findings:  Successful CT-guided bone marrow aspiration and biopsy A total of 1 cores were obtained. Hemostasis of the tract was achieved using Manual Pressure.  Plan: Bed rest for 1 hours.  See detailed procedure note with images in PACS. The patient tolerated the procedure well without incident or complication and was returned to Recovery in stable condition.    Thom Hall, MD [facility]   Pager. [phone] Clinic. [phone]

## 2023-12-31 NOTE — Discharge Instructions (Signed)

## 2024-01-01 NOTE — Therapy (Addendum)
 OUTPATIENT PHYSICAL THERAPY THORACOLUMBAR TREATMENT   Patient Name: Charles Barrera MRN: [id number] DOB:May 14, 1981, 43 y.o., male Today's Date: 01/02/2024  END OF SESSION:  PT End of Session - 01/02/24 1324     Visit Number 4    Date for PT Re-Evaluation 02/17/24    Authorization Type BCBS    PT Start Time 1324    PT Stop Time 1404    PT Time Calculation (min) 40 min    Activity Tolerance Patient tolerated treatment well    Behavior During Therapy [facility] for tasks assessed/performed                History reviewed. No pertinent past medical history. Past Surgical History:  Procedure Laterality Date   FLEXIBLE SIGMOIDOSCOPY N/A 10/27/2023   Procedure: FLEXIBLE SIGMOIDOSCOPY;  Surgeon: Rollin Dover, MD;  Location: [facility] ENDOSCOPY;  Service: Gastroenterology;  Laterality: N/A;   LAMINECTOMY N/A 10/25/2023   Procedure: Thoracic four-thoracic six laminectomy for resection of epidural mass;  Surgeon: Debby Dorn MATSU, MD;  Location: [facility] OR;  Service: Neurosurgery;  Laterality: N/A;   Patient Active Problem List   Diagnosis Date Noted   Abnormal CT scan, colon 11/08/2023   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    PCP: Claudene Round, MD   REFERRING PROVIDER: Debby Dorn MATSU, MD   REFERRING DIAG: [phone] (ICD-10-CM) - Spondylosis with myelopathy, thoracic region  Rationale for Evaluation and Treatment: Rehabilitation  THERAPY DIAG:  Muscle weakness (generalized)  Unsteadiness on feet  Pain in thoracic spine  ONSET DATE: 10/25/23 DOS T4-6 laminectomy for resection of epidural mass  SUBJECTIVE:                                                                                                                                                                                           SUBJECTIVE STATEMENT: Still having pain when sitting at desk.  PERTINENT HISTORY:  unremarkable  PAIN:  Are you having pain?  Yes: NPRS scale: 1/10  Pain location: mid and low back Pain description: pressure Aggravating factors: prolonged standing Relieving factors: siting or changing positions  PRECAUTIONS: none  RED FLAGS: None   WEIGHT BEARING RESTRICTIONS: No  FALLS:  Has patient fallen in last 6 months? 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  DIAGNOSTIC FINDINGS:  12/05/23 MRI IMPRESSION: 1. 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 2. T2 hyperintensity in the cord at the level of prior surgery, likely post compressive. No residual cord mass effect. 3. Unchanged widespread marrow infiltration, correlate with pathology.  PATIENT SURVEYS:  Modified Oswestry 12 / 50 = 24.0 %   COGNITION: Overall cognitive status: Within functional limits for tasks assessed     SENSATION: Not full sensation back but better than prior to surgery  MUSCLE LENGTH: Quads L> R, marked HS, piriformis and gastroc B   POSTURE: rounded shoulders, forward head, and flexed trunk   PALPATION: Tender and tight in bil thoracic paraspinals around incision  LUMBAR ROM: mild tightness with rotation B  AROM eval  Flexion   Extension   Right lateral flexion   Left lateral flexion   Right rotation   Left rotation    (Blank rows = not tested)  LOWER EXTREMITY MMT:     MMT  Right eval Left eval  Hip flexion 4+ 4  Hip extension 5 4+  Hip abduction 4 4-  Hip adduction 5 5  Hip internal rotation    Hip external rotation     Knee flexion 4 5  Knee extension 5 5  Ankle dorsiflexion 5 5  Ankle plantarflexion    Ankle inversion    Ankle eversion     (Blank rows = not tested)  LOWER EXTREMITY ROM:  WFL, but limited by flexibility deficits   FUNCTIONAL TESTS:  5 times sit to stand: 11.53 sec Berg Balance Scale: 50/56 Dynamic Gait Index: 22/24 on 12/26/23  GAIT: Distance walked: 20 Assistive device utilized: Single point cane Level of assistance: Modified independence Comments: slow gait, wide BOS  TREATMENT DATE:                                                                                                                                [facility] Adult PT Treatment:                                                DATE: 01/02/24 Therapeutic Exercise: Blue Tband rows and ext x 20 ea 8 inch step taps no UE support 2x10 B Step ups fwd  8 inch step x 10 B  Step ups lateral 6 inch step x 10 B  STS 2 x 5  no UE support Functional squat x 10 to chair +foam Red band at ankles hip 45 deg kickback 2x10   [facility] Adult PT Treatment:  DATE: 12/30/23 Therapeutic Exercise: 8 inch step taps no UE support 2x10 BIL Red band hip abd 2x8 BIL cues for posture Red band hip ext 2x8 BIL cues for posture Red band hip 45 deg kickback 2x5 STS from chair x8 no UE support     12/26/23 DGI completed Standing foot taps to 8 inch step x 10 B SBA Step up with alt knee raise x 10 ea with UE support Standing march 2 x 10 ea (harder than step up with knee raise) Standing hip ABD 2x10 Standing hip ext x 10 ea Leg press 70# B x 15, unilateral 40# x 15 ea Sit to stand black band around thighs x 10 Sidestepping 4 x 10 black band  Monster walk 2 x 10 black band  Thoracic extension in chair x 10   12/23/23  See pt ed and HEP    PATIENT EDUCATION:  Education details: rationale for interventions, HEP  Person educated: Patient Education method: Explanation, Demonstration, Tactile cues,  Verbal cues Education comprehension: verbalized understanding, returned demonstration, verbal cues required, tactile cues required, and needs further education     HOME EXERCISE PROGRAM: Access Code: [email or web address] URL: [email or web address] Date: 12/26/2023 Prepared by: Mliss  Exercises - Seated Piriformis Stretch with Trunk Bend  - 2 x daily - 7 x weekly - 1 sets - 3 reps - 15-30 hold - Sidelying Thoracic Rotation with Open Book  - 1 x daily - 7 x weekly - 2 sets - 5 reps - Child's Pose Stretch  - 1 x daily - 7 x weekly - 1 sets - 3 reps - 20-30 sec hold - Child's Pose with Sidebending  - 2 x daily - 7 x weekly - 1 sets - 2 reps - 20-30 sec hold - Step Taps on High Step  - 1 x daily - 7 x weekly - 1 sets - 10 reps - Side Stepping with Resistance at Thighs  - 1 x daily - 3-4 x weekly - 2 sets - 10 reps - Forward Monster Walk with Resistance (BKA)  - 1 x daily - 3-4 x weekly - 2 sets - 10 reps - Standing Marching  - 1 x daily - 3-4 x weekly - 2-3 sets - 10 reps - Standing Hip Extension with Counter Support  - 1 x daily - 3-4 x weekly - 2-3 sets - 10 reps - Standing Hip Abduction with Counter Support  - 1 x daily - 3-4 x weekly - 2-3 sets - 10 reps  ASSESSMENT:  CLINICAL IMPRESSION: Patient still c/o of upper back pain with sitting at desk. Offered some changes he could make to his current chair position and lumbar support. We also discussed a stand up desk. Very challenged with 8 inch lateral step ups so dropped to 6 inch.   Tolerated rows and extension without complaint, but wants to focus on legs. He requires short rest breaks between exercises, but did demonstrate improved stability today with standing resisted hip ABD. He also did well with functional squats. Sit to stand is more challenging, but both should be continued.  Eval: Patient is a 43 y.o. male who was seen today for physical therapy evaluation and treatment for thoracic spine pain and LE weakness s/p T4-6 laminectomy  due to epidural mass. He also reports balance deficits and has been using a cane since October. All tests have been negative for cancer. He has a bone marrow test scheduled for early January. He demonstrates B LE weakness, marked flexibility deficits  and higher level balance deficits. His BERG score of 50/56 puts him at moderate risk for falls. No gait belt required today and no incidences of LOB with gait. He will benefit from skilled PT to address these deficits.    OBJECTIVE IMPAIRMENTS: decreased activity tolerance, decreased balance, difficulty walking, decreased ROM, decreased strength, increased muscle spasms, impaired flexibility, postural dysfunction, and pain.   ACTIVITY LIMITATIONS: carrying, lifting, bending, standing, stairs, and locomotion level  PARTICIPATION LIMITATIONS: cleaning, shopping, community activity, occupation, and yard work  PERSONAL FACTORS: Fitness, Profession, and 1 comorbidity: myelopathy  are also affecting patient's functional outcome.   REHAB POTENTIAL: Good  CLINICAL DECISION MAKING: Evolving/moderate complexity  EVALUATION COMPLEXITY: Moderate   GOALS: Goals reviewed with patient? Yes  SHORT TERM GOALS: Target date: 01/13/2024   Patient will be independent with initial HEP. Baseline:  Goal status: INITIAL  2. DGI completed to assess fall risk. Baseline:  Goal status: MET   LONG TERM GOALS: Target date: 02/17/2024  Patient will be independent with advanced/ongoing HEP to improve outcomes and carryover.  Baseline:  Goal status: INITIAL  2.  Patient will be able to ambulate 600' in 6 minutes without AD with good safety to access community.  Baseline:  Goal status: INITIAL  3.  Patient will be able to step up/down curb safely without AD with good safety.  Baseline:  Goal status: INITIAL   4.  Patient will demonstrate decreased 5XSTS by 2-3 seconds showing improved functional strength. Baseline: 11.53 sec Goal status: INITIAL  5.  Patient  will demonstrate at least 19/24 on DGI to improve gait stability and reduce risk for falls. Baseline: TBD Goal status: MET  6.  Patient will score 56 on Berg Balance test to demonstrate lower risk of falls. (MCID= 8 points) .  Baseline: 50 Goal status: INITIAL  7.  Patient will report 6 on Modified Oswestry to demonstrate improved functional ability. Baseline: 12 / 50 = 24.0 % Goal status: INITIAL  8.  Patient will report decreased back pain by 75% to improved QOL. Baseline:  Goal status: INITIAL    PLAN:  PT FREQUENCY: 2x/week  PT DURATION: 8 weeks  PLANNED INTERVENTIONS: 97164- PT Re-evaluation, 97110-Therapeutic exercises, 97530- Therapeutic activity, 97112- Neuromuscular re-education, 97535- Self Care, 02859- Manual therapy, [phone]- Gait training, [phone]- Aquatic Therapy, [phone]- Electrical stimulation (unattended), Patient/Family education, Balance training, Stair training, Taping, Dry Needling, Joint mobilization, Spinal mobilization, Scar mobilization, Cryotherapy, and Moist heat.  PLAN FOR NEXT SESSION: Work on LE strength, MT prn for thoracic paraspinals.   Mliss Cummins, PT  01/02/2024 5:01 PM

## 2024-01-02 ENCOUNTER — Ambulatory Visit: Payer: 59 | Admitting: Physical Therapy

## 2024-01-02 ENCOUNTER — Encounter: Payer: Self-pay | Admitting: Physical Therapy

## 2024-01-02 DIAGNOSIS — R2681 Unsteadiness on feet: Secondary | ICD-10-CM

## 2024-01-02 DIAGNOSIS — M6281 Muscle weakness (generalized): Secondary | ICD-10-CM | POA: Diagnosis not present

## 2024-01-02 DIAGNOSIS — M546 Pain in thoracic spine: Secondary | ICD-10-CM

## 2024-01-03 LAB — SURGICAL PATHOLOGY

## 2024-01-06 ENCOUNTER — Inpatient Hospital Stay: Payer: 59 | Attending: Oncology

## 2024-01-06 NOTE — Therapy (Signed)
 OUTPATIENT PHYSICAL THERAPY THORACOLUMBAR TREATMENT   Patient Name: Charles Barrera MRN: [id number] DOB:18-Apr-1981, 43 y.o., male Today's Date: 01/07/2024  END OF SESSION:  PT End of Session - 01/07/24 1403     Visit Number 5    Date for PT Re-Evaluation 02/17/24    Authorization Type BCBS    PT Start Time 1403    PT Stop Time 1445    PT Time Calculation (min) 42 min    Activity Tolerance Patient tolerated treatment well    Behavior During Therapy [facility] for tasks assessed/performed                 History reviewed. No pertinent past medical history. Past Surgical History:  Procedure Laterality Date   FLEXIBLE SIGMOIDOSCOPY N/A 10/27/2023   Procedure: FLEXIBLE SIGMOIDOSCOPY;  Surgeon: Rollin Dover, MD;  Location: [facility] ENDOSCOPY;  Service: Gastroenterology;  Laterality: N/A;   LAMINECTOMY N/A 10/25/2023   Procedure: Thoracic four-thoracic six laminectomy for resection of epidural mass;  Surgeon: Debby Dorn MATSU, MD;  Location: [facility] OR;  Service: Neurosurgery;  Laterality: N/A;   Patient Active Problem List   Diagnosis Date Noted   Abnormal CT scan, colon 11/08/2023   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    PCP: Claudene Round, MD   REFERRING PROVIDER: Debby Dorn MATSU, MD   REFERRING DIAG: [phone] (ICD-10-CM) - Spondylosis with myelopathy, thoracic region  Rationale for Evaluation and Treatment: Rehabilitation  THERAPY DIAG:  Muscle weakness (generalized)  Unsteadiness on feet  Pain in thoracic spine  ONSET DATE: 10/25/23 DOS T4-6 laminectomy for resection of epidural mass  SUBJECTIVE:                                                                                                                                                                                           SUBJECTIVE STATEMENT: No change in status  PERTINENT HISTORY:  unremarkable  PAIN:  Are you having pain? Yes: NPRS scale:  1/10  Pain location: mid and low back Pain description: pressure Aggravating factors: prolonged standing Relieving factors: siting or changing positions  PRECAUTIONS: none  RED FLAGS: None   WEIGHT BEARING RESTRICTIONS: No  FALLS:  Has patient fallen in last 6 months? 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  DIAGNOSTIC FINDINGS:  12/05/23 MRI IMPRESSION: 1. 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 2. T2 hyperintensity in the cord at the level of prior surgery, likely post compressive. No residual cord mass effect. 3. Unchanged widespread marrow infiltration, correlate with pathology.  PATIENT SURVEYS:  Modified Oswestry 12 / 50 = 24.0 %   COGNITION: Overall cognitive status: Within functional limits for tasks assessed     SENSATION: Not full sensation back but better than prior to surgery  MUSCLE LENGTH: Quads L> R, marked HS, piriformis and gastroc B   POSTURE: rounded shoulders, forward head, and flexed trunk   PALPATION: Tender and tight in bil thoracic paraspinals around incision  LUMBAR ROM: mild tightness with rotation B  AROM eval  Flexion   Extension   Right lateral flexion   Left lateral flexion   Right rotation   Left rotation    (Blank rows = not tested)  LOWER EXTREMITY MMT:     MMT  Right eval Left eval  Hip flexion 4+ 4  Hip extension 5 4+  Hip abduction 4 4-  Hip adduction 5 5  Hip internal rotation    Hip external rotation    Knee flexion 4 5   Knee extension 5 5  Ankle dorsiflexion 5 5  Ankle plantarflexion    Ankle inversion    Ankle eversion     (Blank rows = not tested)  LOWER EXTREMITY ROM:  WFL, but limited by flexibility deficits   FUNCTIONAL TESTS:  5 times sit to stand: 11.53 sec Berg Balance Scale: 50/56 Dynamic Gait Index: 22/24 on 12/26/23  GAIT: Distance walked: 20 Assistive device utilized: Single point cane Level of assistance: Modified independence Comments: slow gait, wide BOS  TREATMENT DATE:                                                                                                                                [facility] Adult PT Treatment:                                                DATE: 01/07/24 Therapeutic Exercise: STS 2 x 5  no UE support Functional squat 2 x 10 to chair +foam Standing HS curl 5# 2x10 B Standing hip ABD and EXT 5#AW 2x10 8 inch step taps no UE support 5#AW 2x10 B Step ups fwd  8 inch step 5# AW x 5 B , then 5 on L with no weight Step ups lateral  R 8 inch step x 10 B - step down on L is shaky Wall sits partial x 2-3, but patient felt his legs were going to give out. Tandem balance B; tandem balance with  head turns Tandem walking   [facility] Adult PT Treatment:                                                DATE: 01/02/24 Therapeutic Exercise: Blue Tband rows and ext x 20 ea 8 inch step taps no UE support 2x10 B Step ups fwd  8 inch step x 10 B  Step ups lateral 6 inch step x 10 B  STS 2 x 5  no UE support Functional squat x 10 to chair +foam Red band at ankles hip 45 deg kickback 2x10   [facility] Adult PT Treatment:                                                DATE: 12/30/23 Therapeutic Exercise: 8 inch step taps no UE support 2x10 BIL Red band hip abd 2x8 BIL cues for posture Red band hip ext 2x8 BIL cues for posture Red band hip 45 deg kickback 2x5 STS from chair x8 no UE support     12/26/23 DGI completed Standing foot taps to 8 inch step x 10 B SBA Step up with  alt knee raise x 10 ea with UE support Standing march 2 x 10 ea (harder than step up with knee raise) Standing hip ABD 2x10 Standing hip ext x 10 ea Leg press 70# B x 15, unilateral 40# x 15 ea Sit to stand black band around thighs x 10 Sidestepping 4 x 10 black band  Monster walk 2 x 10 black band  Thoracic extension in chair x 10   12/23/23  See pt ed and HEP    PATIENT EDUCATION:  Education details: HEP UPDATE  Person educated: Patient Education method: Explanation, Demonstration, Tactile cues, Verbal cues Education comprehension: verbalized understanding, returned demonstration, verbal cues required, tactile cues required, and needs further education     HOME EXERCISE PROGRAM: Access Code: [email or web address] URL: [email or web address] Date: 12/26/2023 Prepared by: Mliss  Exercises - Seated Piriformis Stretch with Trunk Bend  - 2 x daily - 7 x weekly - 1 sets - 3 reps - 15-30 hold - Sidelying Thoracic Rotation with Open Book  - 1 x daily - 7 x weekly - 2 sets - 5 reps - Child's Pose Stretch  - 1 x daily - 7 x weekly - 1 sets - 3 reps - 20-30 sec hold - Child's Pose with Sidebending  - 2 x daily - 7 x weekly - 1 sets - 2 reps - 20-30 sec hold - Step Taps on High Step  - 1 x daily - 7 x weekly - 1 sets - 10 reps - Side Stepping with Resistance at Thighs  - 1 x daily - 3-4 x weekly - 2 sets - 10 reps - Forward Monster Walk with Resistance (BKA)  - 1 x daily - 3-4 x weekly - 2 sets - 10 reps - Standing Marching  - 1 x daily - 3-4 x weekly - 2-3 sets - 10 reps - Standing Hip Extension with Counter Support  - 1 x daily - 3-4 x weekly - 2-3 sets - 10 reps - Standing Hip Abduction with Counter Support  - 1 x daily - 3-4 x weekly - 2-3 sets -  10 reps  ASSESSMENT:  CLINICAL IMPRESSION: Pierson was able to tolerate increased resistance with step ups and other exercises today. We started with sit to stands and functional squats which made these more tolerable than they usually are  at end of session. Tandem stance with head turns and tandem walking are very challenging. Patient feels like he is progressing with strengthening since eval.   Eval: Patient is a 43 y.o. male who was seen today for physical therapy evaluation and treatment for thoracic spine pain and LE weakness s/p T4-6 laminectomy due to epidural mass. He also reports balance deficits and has been using a cane since October. All tests have been negative for cancer. He has a bone marrow test scheduled for early January. He demonstrates B LE weakness, marked flexibility deficits and higher level balance deficits. His BERG score of 50/56 puts him at moderate risk for falls. No gait belt required today and no incidences of LOB with gait. He will benefit from skilled PT to address these deficits.    OBJECTIVE IMPAIRMENTS: decreased activity tolerance, decreased balance, difficulty walking, decreased ROM, decreased strength, increased muscle spasms, impaired flexibility, postural dysfunction, and pain.   ACTIVITY LIMITATIONS: carrying, lifting, bending, standing, stairs, and locomotion level  PARTICIPATION LIMITATIONS: cleaning, shopping, community activity, occupation, and yard work  PERSONAL FACTORS: Fitness, Profession, and 1 comorbidity: myelopathy  are also affecting patient's functional outcome.   REHAB POTENTIAL: Good  CLINICAL DECISION MAKING: Evolving/moderate complexity  EVALUATION COMPLEXITY: Moderate   GOALS: Goals reviewed with patient? Yes  SHORT TERM GOALS: Target date: 01/13/2024   Patient will be independent with initial HEP. Baseline:  Goal status: INITIAL  2. DGI completed to assess fall risk. Baseline:  Goal status: MET   LONG TERM GOALS: Target date: 02/17/2024  Patient will be independent with advanced/ongoing HEP to improve outcomes and carryover.  Baseline:  Goal status: INITIAL  2.  Patient will be able to ambulate 600' in 6 minutes without AD with good safety to access  community.  Baseline:  Goal status: INITIAL  3.  Patient will be able to step up/down curb safely without AD with good safety.  Baseline:  Goal status: INITIAL   4.  Patient will demonstrate decreased 5XSTS by 2-3 seconds showing improved functional strength. Baseline: 11.53 sec Goal status: INITIAL  5.  Patient will demonstrate at least 19/24 on DGI to improve gait stability and reduce risk for falls. Baseline: TBD Goal status: MET  6.  Patient will score 56 on Berg Balance test to demonstrate lower risk of falls. (MCID= 8 points) .  Baseline: 50 Goal status: INITIAL  7.  Patient will report 6 on Modified Oswestry to demonstrate improved functional ability. Baseline: 12 / 50 = 24.0 % Goal status: INITIAL  8.  Patient will report decreased back pain by 75% to improved QOL. Baseline:  Goal status: INITIAL    PLAN:  PT FREQUENCY: 2x/week  PT DURATION: 8 weeks  PLANNED INTERVENTIONS: 97164- PT Re-evaluation, 97110-Therapeutic exercises, 97530- Therapeutic activity, 97112- Neuromuscular re-education, 97535- Self Care, [id number]- Manual therapy, [phone]- Gait training, [phone]- Aquatic Therapy, [phone]- Electrical stimulation (unattended), Patient/Family education, Balance training, Stair training, Taping, Dry Needling, Joint mobilization, Spinal mobilization, Scar mobilization, Cryotherapy, and Moist heat.  PLAN FOR NEXT SESSION: Work on LE strength, MT prn for thoracic paraspinals.   Mliss Cummins, PT  01/07/2024 3:49 PM

## 2024-01-07 ENCOUNTER — Ambulatory Visit: Payer: 59 | Admitting: Physical Therapy

## 2024-01-07 ENCOUNTER — Encounter: Payer: Self-pay | Admitting: Physical Therapy

## 2024-01-07 DIAGNOSIS — M6281 Muscle weakness (generalized): Secondary | ICD-10-CM

## 2024-01-07 DIAGNOSIS — M546 Pain in thoracic spine: Secondary | ICD-10-CM

## 2024-01-07 DIAGNOSIS — R2681 Unsteadiness on feet: Secondary | ICD-10-CM

## 2024-01-09 ENCOUNTER — Inpatient Hospital Stay: Payer: 59

## 2024-01-09 ENCOUNTER — Ambulatory Visit: Payer: 59 | Admitting: Physical Therapy

## 2024-01-09 ENCOUNTER — Inpatient Hospital Stay (HOSPITAL_BASED_OUTPATIENT_CLINIC_OR_DEPARTMENT_OTHER): Payer: 59 | Admitting: Oncology

## 2024-01-09 DIAGNOSIS — M899 Disorder of bone, unspecified: Secondary | ICD-10-CM

## 2024-01-09 DIAGNOSIS — R933 Abnormal findings on diagnostic imaging of other parts of digestive tract: Secondary | ICD-10-CM | POA: Diagnosis not present

## 2024-01-09 NOTE — Assessment & Plan Note (Addendum)
-   Please review oncology history for additional details and timeline of events and past workup.  Pathology from epidural mass/laminectomy showed overall benign findings with some atypical lymphoid infiltrate. CT scan showed no lymph node enlargement but did show a concerning area in the colon. Patient reports weakness in legs and core, but strength is improving. No bowel issues, weight loss, or other systemic symptoms reported.  PET scan on 11/19/2023 showed postsurgical changes from recent thoracic spine laminectomies and tumor resection.  Associated expected hypermetabolism in the surgical bed.  Diffuse mixed lytic and sclerotic osseous process with areas of moderate hypermetabolism with SUV max of 5.32.  Findings likely primary osseous neoplastic process such as lymphoma or myeloma.  No findings for extraosseous primary neoplastic process or metastatic disease.  -Given CT findings, PET scan and MRI findings, there was still clinical concern for metastatic malignancy of unknown primary.  Lymphomatous process could not be definitively ruled out.  Workup for monoclonal gammopathy came back negative.  -Since patient's PET scan could not give [place] a definitive answer regarding a primary, we proceeded with bone marrow biopsy/aspiration on 12/31/2023.  Bone marrow biopsy showed overall normocellular to mildly hypocellular bone marrow with a mild reversed M : E ratio with otherwise orderly trilineage hematopoiesis.  A few reactive lymphoid aggregates.  Flow cytometry was unremarkable.  No evidence of lymphoma, myeloma or other malignancy.  His case was discussed in neuro/spine conference on 01/06/2024.  Consensus opinion was to observe with repeat imaging in 6 months.  -Continue Pregabalin for neuropathic pain.  -Refill pain medication as needed.  -Follow-up with neurosurgeon, Dr. Maisie Fus.   -Request placed for MRI of the thoracic and lumbar spine to be done in June 2025.  I will see him in clinic with  results.

## 2024-01-09 NOTE — Assessment & Plan Note (Addendum)
Previous CT scan of abdomen on 10/25/2023 showed potential stricture in the colon. No current symptoms reported. -He had sigmoidoscopy on 11/07/2023, performed by Dr. Elnoria Howard.  No abnormalities noted.

## 2024-01-09 NOTE — Progress Notes (Signed)
[name]  HEMATOLOGY-ONCOLOGY ELECTRONIC VISIT PROGRESS NOTE  Patient Care Team: Caffie Damme, MD as PCP - General (Family Medicine) Bedelia Person, MD as Consulting Physician (Neurosurgery)  I connected with the patient via telephone conference and verified that I am speaking with the correct person using two identifiers. The patient's location is at home and I am providing care from the [facility].  I discussed the limitations, risks, security and privacy concerns of performing an evaluation and management service by e-visits and the availability of in person appointments.  I also discussed with the patient that there may be a patient responsible charge related to this service. The patient expressed understanding and agreed to proceed.   ASSESSMENT & PLAN:   Charles Barrera is a 43 y.o. gentleman with no significant past medical history presented to the hospital in October 2024 for progressive weakness and walking difficulty. He began having mid back pain in February 2024, but in September, began having difficulty with leg weakness and walking. He had workup at [facility] and Dr. Precious Gilding ortho spine and numerous bone lesions were seen in his lumbar spine 09/28/23. By patient's report, there was concern for myeloma but labs were negative or inconclusive. He began having increased walking difficulty which prompted visit to [facility] ER.  Clinical picture was concerning for metastatic disease to the bones but workup so far showed no signs of malignancy.  Lytic bone lesions on xray - Please review oncology history for additional details and timeline of events and past workup.  Pathology from epidural mass/laminectomy showed overall benign findings with some atypical lymphoid infiltrate. CT scan showed no lymph node enlargement but did show a concerning area in the colon. Patient reports weakness in legs and core, but strength is improving. No bowel issues, weight  loss, or other systemic symptoms reported.  PET scan on 11/19/2023 showed postsurgical changes from recent thoracic spine laminectomies and tumor resection.  Associated expected hypermetabolism in the surgical bed.  Diffuse mixed lytic and sclerotic osseous process with areas of moderate hypermetabolism with SUV max of 5.32.  Findings likely primary osseous neoplastic process such as lymphoma or myeloma.  No findings for extraosseous primary neoplastic process or metastatic disease.  -Given CT findings, PET scan and MRI findings, there was still clinical concern for metastatic malignancy of unknown primary.  Lymphomatous process could not be definitively ruled out.  Workup for monoclonal gammopathy came back negative.  -Since patient's PET scan could not give [place] a definitive answer regarding a primary, we proceeded with bone marrow biopsy/aspiration on 12/31/2023.  Bone marrow biopsy showed overall normocellular to mildly hypocellular bone marrow with a mild reversed M : E ratio with otherwise orderly trilineage hematopoiesis.  A few reactive lymphoid aggregates.  Flow cytometry was unremarkable.  No evidence of lymphoma, myeloma or other malignancy.  His case was discussed in neuro/spine conference on 01/06/2024.  Consensus opinion was to observe with repeat imaging in 6 months.  -Continue Pregabalin for neuropathic pain.  -Refill pain medication as needed.  -Follow-up with neurosurgeon, Dr. Maisie Fus.   -Request placed for MRI of the thoracic and lumbar spine to be done in June 2025.  I will see him in clinic with results.   Abnormal CT scan, colon Previous CT scan of abdomen on 10/25/2023 showed potential stricture in the colon. No current symptoms reported. -He had sigmoidoscopy on 11/07/2023, performed by Dr. Elnoria Howard.  No abnormalities noted.   Orders Placed This Encounter  Procedures  MR THORACIC SPINE W WO CONTRAST    Standing Status:   Future    Expected Date:   06/08/2024    Expiration  Date:   01/08/2025    GRA to provide read?:   Yes    If indicated for the ordered procedure, I authorize the administration of contrast media per Radiology protocol:   Yes    What is the patient's sedation requirement?:   No Sedation    Use SRS Protocol?:   No    Does the patient have a pacemaker or implanted devices?:   No    Preferred imaging location?:   [facility] (table limit - 550 lbs)   MR Lumbar Spine W Wo Contrast    Standing Status:   Future    Expected Date:   06/08/2024    Expiration Date:   01/08/2025    If indicated for the ordered procedure, I authorize the administration of contrast media per Radiology protocol:   Yes    What is the patient's sedation requirement?:   No Sedation    Does the patient have a pacemaker or implanted devices?:   No    Use SRS Protocol?:   No    Preferred imaging location?:   [facility] (table limit - 550 lbs)    INTERVAL HISTORY:  Please see above for problem oriented charting.  The purpose of today's discussion is to explain recent lab results and to formulate plan of care.  Patient had appointment for in-person visit but opted to do telephone visit instead.  He reports that his pain is not severe but is noticeable after sitting for long periods at work. He has been attending physical therapy sessions, but progress has been slower than he would like. He still experiences leg weakness and requires a cane for assistance with walking. He also mentions that he is not able to walk straight due to the weakness in his legs.  In addition to these symptoms, Charles Barrera had a surgery in November and a subsequent sigmoidoscopy. He also mentions an incident of hematuria that occurred once, about two to three weeks ago. He has not experienced any issues with bowel movements recently.  SUMMARY OF ONCOLOGIC HISTORY:  Oncology History  Lytic bone lesions on xray  10/23/2023 Initial Diagnosis   Patient with no significant past medical  history presented to the hospital on 10/23/23 for progressive weakness and walking difficulty. He began having mid back pain in February, but in September 2024, began having difficulty with leg weakness and walking. He had workup at [facility] and Dr. Precious Gilding ortho spine and numerous bone lesions were seen in his lumbar spine 10/5. By patient's report, there was concern for myeloma but labs were negative or inconclusive. He began having increased walking difficulty which prompted visit to [facility] ER. He has been walking with a cane but feels like the last few weeks he has gotten worse to the point he may need a wheelchair. He has numbness in his legs and in his trunk from the chest down.Concurrently, he has been experiencing a sensation of numbness in his stomach, which he first noticed approximately two to three weeks ago. The patient denies any other symptoms such as blood in stools, changes in appetite or weight, nausea, vomiting, heartburn, acid reflux, scrotal swelling, or abdominal pain. He has no known family history of cancer and does not smoke. The patient has sought medical attention for these symptoms since September, undergoing an MRI and  CT scan, but the cause of his symptoms remains undetermined.   No family hx of blood cancers/dyscrasias, cancers. He does not smoke.    In the ED, MRI of the thoracic and lumbar spine was concerning for metastatic disease involving nearly all vertebral bodies, sacrum, and iliac bones with extraosseous mass extending from T4-T6 and displacing the spinal cord.    With these findings, neurosurgery and oncology service was consulted for further recommendations.   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  No evidence of regional pathologic fracture.  Extraosseous mass in the posterior right side of the spinal canal extending from mid T4 to mid T6.  This  displaced spinal cord and could be compressing the cord.  MRI of the lumbar spine: Metastatic disease affecting all the vertebral bodies throughout the region as well as the sacrum and iliac bones.  No evidence of regional pathologic fracture.  No extraosseous tumor in this region.  Mild noncompressive disc bulges at L4-5 and L5-S1.   10/25/2023 Surgery   Given high-grade cord compression, decision made to resect the epidural tumor which would serve both diagnostic and therapeutic benefits.   On 10/25/2023, Dr. Hoyt Koch, neurosurgery performed thoracic 4, thoracic 5 and thoracic 6 laminectomy, right medial facetectomies, resection of epidural mass T4-5-6.  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  T5 nerve root was enveloped with tumor, small residual tumor left around nerve root and thin layer in right ventral epidural space.   10/25/2023 Pathology Results   Pathology from epidural mass showed dense hyalinized fibrovascular stroma with an atypical lymphoid infiltrate.  Thoracic spinous process and lamina resection showed benign cortical and trabecular bone with associated dense fibroconnective tissue and skeletal muscle.  Unremarkable marrow.  Immunohistochemical stains reveal the infiltrate is comprised of a mixture of CD20 positive B lymphocytes and CD3 positive T lymphocytes.  There appear to be more CD20 positive B lymphocytes than CD3 positive T lymphocytes.  The B lymphocytes are positive for seen PAX5 and CD79a.  They are negative for CD10, BCL6 and cyclin D1.  While there appears to be focal CD5 expression, interpretation is limited by the large number of background T lymphocytes.  Bcl-2 and CD43 highlight the T lymphocytes.  Ki-67 proliferation index is low approximately 5%.  Other pertinent negative stains include EBV ISH, kappa/lambda ISH, MUM1-1, and CD30.  CD138 highlights rare plasma cells.  CD21 and CD23 highlight scattered cells and follicular  dendritic meshwork.  Flow cytometric analysis does not reveal any evidence of a clonal B-cell population.  While the patient's history of vertebral lesions is noted, no definitive features diagnostic of lymphoma are identified in this specimen, and sampling artifact cannot be excluded.  A low-grade B-cell lymphoma FISH panel will be sent for further characterization and will be reported in an addendum.    10/25/2023 Tumor Marker   CEA, PSA, AFP, beta-hCG, LDH, beta-2 microglobulin were all within normal limits.  SPEP, IFE were unremarkable and showed no monoclonal protein.  Serum free kappa and lambda were within normal limits and ratio was normal at 0.96.  No evidence of anemia, renal dysfunction or hypercalcemia.   10/25/2023 Imaging   CT chest abdomen and pelvis with contrast: Diffuse heterogeneous bone sclerosis, likely indicating metastatic disease or myeloma.  Left paraspinal mass extending from T4-T8 level.  Focal narrowing of the colon at the rectosigmoid junction, possibly mass, stricture or muscular hypertrophy.  Consider endoscopy if clinically indicated.   11/07/2023 Miscellaneous   Pathology from epidural mass/laminectomy showed benign findings with some atypical lymphoid infiltrate.  Clinical picture remains concerning for malignancy/lymphoma.  Request submitted for PET scan for further evaluation.  Plan to rebiopsy the most FDG avid lesion that is accessible.   11/19/2023 PET scan   PET scan showed postsurgical changes from recent thoracic spine laminectomies and tumor resection.  Associated expected hypermetabolism in the surgical bed.  Diffuse mixed lytic and sclerotic osseous process with areas of moderate hypermetabolism with SUV max of 5.32.  Findings likely primary osseous neoplastic process such as lymphoma or myeloma.  No findings for extraosseous primary neoplastic process or metastatic disease.   12/06/2023 Miscellaneous   Plan made to proceed with bone marrow biopsy and  aspiration for further evaluation to rule out lymphoma or nonsecretory myeloma.   12/31/2023 Bone Marrow Biopsy   Overall normocellular to mildly hypocellular bone marrow with a mild reversed M : E ratio with otherwise orderly trilineage hematopoiesis.  A few reactive lymphoid aggregates.  Flow cytometry was unremarkable.  No evidence of lymphoma, myeloma or other malignancy.   01/06/2024 Miscellaneous   His case was discussed in neuro/spine conference on 01/06/2024.  Consensus opinion was to observe with repeat imaging in 6 months.     REVIEW OF SYSTEMS:    Review of Systems - Oncology  All other pertinent systems were reviewed with the patient and are negative.  I have reviewed the past medical history, past surgical history, social history and family history with the patient and they are unchanged from previous note.  ALLERGIES:  He has no known allergies.  MEDICATIONS:  Current Outpatient Medications  Medication Sig Dispense Refill   oxyCODONE (OXY IR/ROXICODONE) 5 MG immediate release tablet Take 1 tablet (5 mg total) by mouth every 6 (six) hours as needed for moderate pain (pain score 4-6). 30 tablet 0   pregabalin (LYRICA) 150 MG capsule Take 150 mg by mouth 2 (two) times daily.     No current facility-administered medications for this visit.    PHYSICAL EXAMINATION: ECOG PERFORMANCE STATUS: 1 - Symptomatic but completely ambulatory  LABORATORY DATA:   I have reviewed the data as listed.  Recent Results (from the past 2160 hours)  CBC with Differential     Status: None   Collection Time: 10/23/23  2:34 PM  Result Value Ref Range   WBC 5.3 4.0 - 10.5 K/uL   RBC 4.91 4.22 - 5.81 MIL/uL   Hemoglobin 14.0 13.0 - 17.0 g/dL   HCT 16.1 09.6 - 04.5 %   MCV 87.2 80.0 - 100.0 fL   MCH 28.5 26.0 - 34.0 pg   MCHC 32.7 30.0 - 36.0 g/dL   RDW 40.9 81.1 - 91.4 %   Platelets 203 150 - 400 K/uL   nRBC 0.0 0.0 - 0.2 %   Neutrophils Relative % 55 %   Neutro Abs 2.9 1.7 - 7.7 K/uL    Lymphocytes Relative 37 %   Lymphs Abs 1.9 0.7 - 4.0 K/uL   Monocytes Relative 7 %   Monocytes Absolute 0.4 0.1 - 1.0 K/uL   Eosinophils Relative 1 %   Eosinophils Absolute 0.1 0.0 - 0.5 K/uL   Basophils Relative 0 %   Basophils Absolute 0.0 0.0 - 0.1 K/uL   Immature Granulocytes 0 %   Abs Immature Granulocytes 0.01 0.00 - 0.07 K/uL    Comment: Performed at [facility], [address] [place], [place] [address]  Basic metabolic panel     Status: None   Collection Time: 10/23/23  2:34 PM  Result Value Ref Range   Sodium 139 135 - 145 mmol/L   Potassium 3.5 3.5 - 5.1 mmol/L   Chloride 101 98 - 111 mmol/L   CO2 29 22 - 32 mmol/L   Glucose, Bld 91 70 - 99 mg/dL    Comment: Glucose reference range applies only to samples taken after fasting for at least 8 hours.   BUN 10 6 - 20 mg/dL   Creatinine, Ser 4.01 0.61 - 1.24 mg/dL   Calcium 9.1 8.9 - 02.7 mg/dL   GFR, Estimated >25 >36 mL/min    Comment: (NOTE) Calculated using the CKD-EPI Creatinine Equation (2021)    Anion gap 9 5 - 15    Comment: Performed at [facility], [address] [place], [place] [address]  HIV Antibody (routine testing w rflx)     Status: None   Collection Time: 10/24/23  7:28 AM  Result Value Ref Range   HIV Screen 4th Generation wRfx Non Reactive Non Reactive    Comment: Performed at [facility], [address], [place], [place] [address]  Basic metabolic panel     Status: Abnormal   Collection Time: 10/24/23  7:28 AM  Result Value Ref Range   Sodium 140 135 - 145 mmol/L   Potassium 3.7 3.5 - 5.1 mmol/L   Chloride 105 98 - 111 mmol/L   CO2 25 22 - 32 mmol/L   Glucose, Bld 108 (H) 70 - 99 mg/dL    Comment: Glucose reference range applies only to samples taken after fasting for at least 8 hours.   BUN 9 6 - 20 mg/dL   Creatinine, Ser 9.56 0.61 - 1.24 mg/dL   Calcium 9.1 8.9 - 38.7 mg/dL   GFR, Estimated >56 >43 mL/min    Comment: (NOTE) Calculated using the CKD-EPI  Creatinine Equation (2021)    Anion gap 10 5 - 15    Comment: Performed at [facility], [address] [place], [place] [address]  CBC     Status: None   Collection Time: 10/24/23  7:28 AM  Result Value Ref Range   WBC 5.3 4.0 - 10.5 K/uL   RBC 5.07 4.22 - 5.81 MIL/uL   Hemoglobin 14.2 13.0 - 17.0 g/dL   HCT 88.4 16.6 - 06.3 %   MCV 87.0 80.0 - 100.0 fL   MCH 28.0 26.0 - 34.0 pg   MCHC 32.2 30.0 - 36.0 g/dL   RDW 01.6 01.0 - 93.2 %   Platelets 202 150 - 400 K/uL   nRBC 0.0 0.0 - 0.2 %    Comment: Performed at [facility], [address], [place], [place] [address]  ABO/Rh     Status: None   Collection Time: 10/24/23  7:28 AM  Result Value Ref Range   ABO/RH(D)      O POS Performed at [facility], [address] [place], [place] [address]   Multiple Myeloma Panel (SPEP&IFE w/QIG)     Status: None   Collection Time: 10/24/23 11:43 AM  Result Value Ref Range   IgG (Immunoglobin G), Serum 1,265 603 - 1,613 mg/dL   IgA 427 90 - 062 mg/dL   IgM (Immunoglobulin M), Srm 104 20 - 172 mg/dL   Total Protein ELP 7.5 6.0 - 8.5 g/dL   Albumin SerPl Elph-Mcnc 4.3 2.9 -  4.4 g/dL   Alpha 1 0.2 0.0 - 0.4 g/dL   Alpha2 Glob SerPl Elph-Mcnc 0.7 0.4 - 1.0 g/dL   B-Globulin SerPl Elph-Mcnc 1.1 0.7 - 1.3 g/dL   Gamma Glob SerPl Elph-Mcnc 1.2 0.4 - 1.8 g/dL   M Protein SerPl Elph-Mcnc Not Observed Not Observed g/dL   Globulin, Total 3.2 2.2 - 3.9 g/dL   Albumin/Glob SerPl 1.4 0.7 - 1.7   IFE 1 Comment     Comment: (NOTE) The immunofixation pattern appears unremarkable. Evidence of monoclonal protein is not apparent.    Please Note Comment     Comment: (NOTE) Protein electrophoresis scan will follow via computer, mail, or courier delivery. Performed At: [facility] [address] [place], [place] [id number] Jolene Schimke MD WU:[id number]   Kappa/lambda light chains     Status: None   Collection Time: 10/24/23 11:43 AM  Result Value Ref Range   Kappa  free light chain 13.4 3.3 - 19.4 mg/L   Lambda free light chains 13.9 5.7 - 26.3 mg/L   Kappa, lambda light chain ratio 0.96 0.26 - 1.65    Comment: (NOTE) Performed At: [facility] [address] [place], [place] [id number] Jolene Schimke MD VH:[id number]   Lactate dehydrogenase     Status: None   Collection Time: 10/24/23 11:43 AM  Result Value Ref Range   LDH 171 98 - 192 U/L    Comment: Performed at [facility], [address], [place], [place] [address]  Type and screen [facility]     Status: None   Collection Time: 10/24/23  8:06 PM  Result Value Ref Range   ABO/RH(D) O POS    Antibody Screen NEG    Sample Expiration      10/27/2023,2359 Performed at [facility], [address], [place], [place] [address]   Surgical pathology     Status: None   Collection Time: 10/25/23 12:00 AM  Result Value Ref Range   SURGICAL PATHOLOGY      Surgical Pathology CASE: [id number] PATIENT: Apollo Krieger Flow Pathology Report     Clinical history: Epidural Mass     DIAGNOSIS:  - No abnormal B or T-cell population identified  GATING AND PHENOTYPIC ANALYSIS:  Gated population: Flow cytometric immunophenotyping is performed using antibodies to the antigens listed in the table below. Electronic gates are placed around a cell cluster displaying light scatter properties corresponding to: lymphocytes  Abnormal Cells in gated population: N/A  Phenotype of Abnormal Cells: N/A                       Lymphoid Antigens       Myeloid Antigens Miscellaneous CD2  tested    CD10 tested    CD11b     ND   CD45 tested CD3  tested    CD19 tested    CD11c     ND   HLA-Dr    ND CD4  tested    CD20 tested    CD13 ND   CD34 tested CD5  tested    CD22 ND   CD14 ND   CD38 tested CD7  tested    CD79b     ND   CD15 ND   CD138     ND CD8  tested    CD103     ND   CD16 ND   TdT  ND CD25 ND   CD200     tested  CD33 ND    CD123     ND TCRab     ND    sKappa    tested    CD64 ND   CD41 ND TCRgd     tested    sLambda   tested    CD117     ND   CD61 ND CD56 tested    cKappa    ND   MPO  ND   CD71 ND CD57 ND   cLambda   ND        [id number]      GROSS DESCRIPTION:  Reference Tissue case [id number].    Final Diagnosis performed by Clifton James, MD.   Electronically signed 11/01/2023 Technical and / or Professional components performed at [facility], [address] [place], [place] [address].  The above tests were developed and their performance characteristics determined by the [facility] system for the physical and immunophenotypic characterization of cell populations. They have not been cleared by the U.S. Food and Drug administration. The  FDA has determined that such clearance or approval is not necessary. This test is used for clinical purposes. It should not be  regarded as investigational or for research   Basic metabolic panel     Status: Abnormal   Collection Time: 10/25/23  5:06 AM  Result Value Ref Range   Sodium 139 135 - 145 mmol/L   Potassium 3.8 3.5 - 5.1 mmol/L   Chloride 107 98 - 111 mmol/L   CO2 24 22 - 32 mmol/L   Glucose, Bld 180 (H) 70 - 99 mg/dL    Comment: Glucose reference range applies only to samples taken after fasting for at least 8 hours.   BUN 12 6 - 20 mg/dL   Creatinine, Ser 4.13 0.61 - 1.24 mg/dL   Calcium 9.3 8.9 - 24.4 mg/dL   GFR, Estimated >01 >02 mL/min    Comment: (NOTE) Calculated using the CKD-EPI Creatinine Equation (2021)    Anion gap 8 5 - 15    Comment: Performed at [facility], [address], [place], [place] [address]  CBC     Status: Abnormal   Collection Time: 10/25/23  5:06 AM  Result Value Ref Range   WBC 10.6 (H) 4.0 - 10.5 K/uL   RBC 5.24 4.22 - 5.81 MIL/uL   Hemoglobin 14.9 13.0 - 17.0 g/dL   HCT 64.4 03.4 - 74.2 %   MCV 86.1 80.0 - 100.0 fL   MCH 28.4 26.0 - 34.0 pg   MCHC 33.0 30.0 - 36.0 g/dL   RDW 59.5 63.8 - 75.6 %   Platelets  235 150 - 400 K/uL   nRBC 0.0 0.0 - 0.2 %    Comment: Performed at [facility], [address], [place], [place] [address]  Beta 2 microglobulin, serum     Status: None   Collection Time: 10/25/23  5:06 AM  Result Value Ref Range   Beta-2 Microglobulin 1.1 0.6 - 2.4 mg/L    Comment: (NOTE) Siemens Immulite 2000 Immunochemiluminometric assay (ICMA) Values obtained with different assay methods or kits cannot be used interchangeably. Results cannot be interpreted as absolute evidence of the presence or absence of malignant disease. Performed At: [facility] [address] [place], [place] [id number] Jolene Schimke MD YT:[id number]   AFP tumor marker     Status: None   Collection Time: 10/25/23  5:06 AM  Result Value Ref Range   AFP, Serum, Tumor Marker  3.4 0.0 - 6.9 ng/mL    Comment: (NOTE) Roche [employer]ostics Electrochemiluminescence Immunoassay (ECLIA) Values obtained with different assay methods or kits cannot be used interchangeably.  Results cannot be interpreted as absolute evidence of the presence or absence of malignant disease. This test is not interpretable in pregnant females. Performed At: [facility] [address] [place], [place] [id number] Jolene Schimke MD WU:[id number]   CEA     Status: None   Collection Time: 10/25/23  5:06 AM  Result Value Ref Range   CEA 2.8 0.0 - 4.7 ng/mL    Comment: (NOTE)                             Nonsmokers          <3.9                             Smokers             <5.6 Roche Diagnostics Electrochemiluminescence Immunoassay (ECLIA) Values obtained with different assay methods or kits cannot be used interchangeably.  Results cannot be interpreted as absolute evidence of the presence or absence of malignant disease. Performed At: [facility] [address] [place], [place] [id number] Jolene Schimke MD VH:[id number]   Beta hCG quant (ref lab)     Status: None   Collection Time: 10/25/23  5:06 AM   Result Value Ref Range   hCG Quant <1 0 - 3 mIU/mL    Comment: (NOTE) Roche ECLIA methodology Performed At: [facility] [address] [place], [place] [id number] Jolene Schimke MD UV:[id number]   PSA     Status: None   Collection Time: 10/25/23  5:06 AM  Result Value Ref Range   Prostatic Specific Antigen 0.45 0.00 - 4.00 ng/mL    Comment: (NOTE) While PSA levels of <=4.00 ng/ml are reported as reference range, some men with levels below 4.00 ng/ml can have prostate cancer and many men with PSA above 4.00 ng/ml do not have prostate cancer.  Other tests such as free PSA, age specific reference ranges, PSA velocity and PSA doubling time may be helpful especially in men less than 71 years old. Performed at [facility], [address] [place], [place] [address]   Surgical pcr screen     Status: None   Collection Time: 10/25/23  9:39 AM   Specimen: Nasal Mucosa; Nasal Swab  Result Value Ref Range   MRSA, PCR NEGATIVE NEGATIVE   Staphylococcus aureus NEGATIVE NEGATIVE    Comment: (NOTE) The Xpert SA Assay (FDA approved for NASAL specimens in patients 91 years of age and older), is one component of a comprehensive surveillance program. It is not intended to diagnose infection nor to guide or monitor treatment. Performed at [facility], [address], [place], [place] [address]   Surgical pathology     Status: None   Collection Time: 10/25/23  4:58 PM  Result Value Ref Range   SURGICAL PATHOLOGY      SURGICAL PATHOLOGY **THIS IS AN ADDENDUM REPORT ** CASE: [id number] PATIENT: Charles Barrera Surgical Pathology Report **Addendum **  Reason for Addendum #1:  Ancillary Studies  Clinical History: epidural mass (cm)   -  FINAL MICROSCOPIC DIAGNOSIS:  A. THORACIC EPIDURAL MASS, RESECTION: - Dense hyalinized fibrovascular stroma with an atypical lymphoid infiltrate  B. THORACIC SPINOUS PROCESS AND LAMINA, RESECTION: - Benign cortical and  trabecular bone with associated dense fibroconnective tissue and skeletal muscle - Unremarkable marrow  C. THORACIC EPIDURAL MASS, RESECTION: - Dense hyalinized fibrovascular stroma with an atypical lymphoid infiltrate.  See comment.  COMMENT:  Morphologic evaluation from part A and C reveals an atypical lymphoid infiltrate in the background of dense hyalinized fibrovascular stroma. The lymphocytes appear small to medium sized.  Immunohistochemical stains reveal the infiltrate is comprised of a mixture  of CD20 positive B lymphocytes and CD3 positive T lymphocytes.  There appear to be more CD20 positive B lymphocytes than CD3 positive T lymphocytes.  The B lymphocytes are positive for seen PAX5 and CD79a.  They are negative for CD10, BCL6 and cyclin D1.  While there appears to be focal CD5 expression, interpretation is limited by the large number of background T lymphocytes.  Bcl-2 and CD43 highlight the T lymphocytes.  Ki-67 proliferation index is low approximately 5%.  Other pertinent negative stains include EBV ISH, kappa/lambda ISH, MUM1-1, and CD30.  CD138 highlights rare plasma cells.  CD21 and CD23 highlight scattered cells and follicular dendritic meshwork.  Flow cytometric analysis does not reveal any evidence of a clonal B-cell population.  While the patient's history of vertebral lesions is noted, no definitive features diagnostic of lymphoma are identified in this specimen, and sampling artifact cannot be excluded.  A low-grade B-cell lymphoma FISH panel wil l be sent for further characterization and will be reported in an addendum. Correlation with pending studies, clinical findings such as other sites of lymphadenopathy, and imaging are recommended.   INTRAOPERATIVE DIAGNOSIS:  A.  Thoracic Epidural Mass: "lymphoplasmacytic infiltrate." Intraoperative diagnosis rendered by Dr. Maurice March at 17:40 on 25 October 2023.  GROSS DESCRIPTION:  A. Received fresh for  intraoperative consultation labeled with the patient's name and "Thoracic epidural mass" is a 0.8 x 0.7 x 0.2 cm piece of tan soft tissue that is entirely submitted on a single chuck for frozen section diagnosis and subsequently in cassette A1 for permanent.  B. Received in saline labeled with the patient's name and "Thoracic spinous process and lamina" is a 3.4 x 2.9 x 1.0 cm aggregate of tan, firm, jagged bone with a minimal amount of soft tissue attached. Representative sections are submitted as follows: B1: soft tissue B2-B3; bony tissue after decalcification   C. Received in saline labeled with the patient's name and "Thoracic epidural mass" is a 2.7 x 2.7 x 1.1 cm aggregate of tan, ragged soft tissue and jagged, firm bony tissue. A piece is placed in RPMI and sent to [facility] for possible flow cytometry. A representative portion is submitted in three cassettes after decalcification in Immunocal.  (LEF 10/28/2023)  Final Diagnosis performed by Clifton James, MD.   Electronically signed 11/01/2023 Technical component performed at [employer]. [facility], [address] [place], [place] [address].  Professional component performed at [facility], [address], [place], [place] [address].  Immunohistochemistry Technical component (if applicable) was performed at [facility]. [address], [place], [place] [address].   IMMUNOHISTOCHEMISTRY DISCLAIMER (if applicable): Some of these immunohistochemical stains may have been developed and the performance charac teristics determine by [facility]. Some may not have been cleared or approved by the U.S. Food and Drug Administration. The FDA has determined that such clearance or approval is not necessary. This test is used for clinical purposes. It should not be regarded as investigational or for research. This laboratory is certified under the Clinical  Laboratory Improvement Amendments of 1988 (CLIA-88) as qualified to perform high complexity clinical laboratory testing.  The controls stained appropriately.   IHC stains are performed on formalin fixed, paraffin embedded tissue using a 3,3"diaminobenzidine (DAB) chromogen and Leica Bond Autostainer System. The staining intensity of the nucleus is score manually and is reported as the percentage of tumor cell nuclei demonstrating specific nuclear staining. The specimens are fixed in 10% Neutral Formalin for at least 6 hours and up to 72hrs. These tests are validated on decalcified tissue. Results should be interpreted wi th caution given the possibility of false negative results on decalcified specimens. Antibody Clones are as follows ER-clone 52F, PR-clone 16, Ki67- clone MM1. Some of these immunohistochemical stains may have been developed and the performance characteristics determined by [facility].  ADDENDUM: - A Low-grade B-cell lymphoma FISH panel performed at [facility] revealed an IgH/Bcl-2 translocation.  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  However, in the absence of a significant morphologic correlate and absence of clonality on flow cytometry, a definitive diagnosis of lymphoma cannot be rendered.  Moreover, IgH/Bcl-2 translocation may be seen in normal individuals as well. Therefore, repeat sampling from potential sites of lymphadenopathy, along with peripheral blood flow cytometry may be performed as clinically indicated for more definitive assessment.         Addendum #1 per formed by Clifton James, MD.   Electronically signed 11/12/2023 Technical component performed at [facility]. [facility], [address] [place], [place] [address].  Professional component performed at [facility], [address], [place], [place] [address].   Immunohistochemistry Technical component (if applicable) was performed at [facility]. [address], [place], [place] [address].   IMMUNOHISTOCHEMISTRY DISCLAIMER (if applicable): Some of these immunohistochemical stains may have been developed and the performance characteristics determine by [facility]. Some may not have been cleared or approved by the U.S. Food and Drug Administration. The FDA has determined that such clearance or approval is not necessary. This test is used for clinical purposes. It should not be regarded as investigational or for research. This laboratory is certified under the Clinical Laboratory Improvement Amendments of 198 8 (CLIA-88) as qualified to perform high complexity clinical laboratory testing.  The controls stained appropriately.   IHC stains are performed on formalin fixed, paraffin embedded tissue using a 3,3"diaminobenzidine (DAB) chromogen and Leica Bond Autostainer System. The staining intensity of the nucleus is score manually and is reported as the percentage of tumor cell nuclei demonstrating specific nuclear staining. The specimens are fixed in 10% Neutral Formalin for at least 6 hours and up to 72hrs. These tests are validated on decalcified tissue. Results should be interpreted with caution given the possibility of false negative results on decalcified specimens. Antibody Clones are as follows ER-clone 52F, PR-clone 16, Ki67- clone MM1. Some of these immunohistochemical stains may have been developed and the performance characteristics determined by [facility].   Basic metabolic panel     Status: Abnormal   Collection Time: 10/26/23  5:25 AM  Result Value Ref Range   Sodium 137 135 - 145 mmol/L   Potassium 4.3 3.5 - 5.1 mmol/L    Comment: HEMOLYSIS AT THIS LEVEL MAY AFFECT RESULT   Chloride 105 98 - 111 mmol/L   CO2 24 22 - 32 mmol/L   Glucose, Bld 140 (H) 70 - 99 mg/dL    Comment:  Glucose reference range  applies only to samples taken after fasting for at least 8 hours.   BUN 13 6 - 20 mg/dL   Creatinine, Ser 1.61 0.61 - 1.24 mg/dL   Calcium 8.6 (L) 8.9 - 10.3 mg/dL   GFR, Estimated >09 >60 mL/min    Comment: (NOTE) Calculated using the CKD-EPI Creatinine Equation (2021)    Anion gap 8 5 - 15    Comment: Performed at [facility], [address] [place], [place] [address]  CBC     Status: Abnormal   Collection Time: 10/26/23  5:25 AM  Result Value Ref Range   WBC 16.0 (H) 4.0 - 10.5 K/uL   RBC 4.41 4.22 - 5.81 MIL/uL   Hemoglobin 12.2 (L) 13.0 - 17.0 g/dL   HCT 81.1 (L) 91.4 - 78.2 %   MCV 86.2 80.0 - 100.0 fL   MCH 27.7 26.0 - 34.0 pg   MCHC 32.1 30.0 - 36.0 g/dL   RDW 95.6 21.3 - 08.6 %   Platelets 212 150 - 400 K/uL   nRBC 0.0 0.0 - 0.2 %    Comment: Performed at [facility], [address], [place], [place] [address]  CBC     Status: Abnormal   Collection Time: 10/27/23  1:27 PM  Result Value Ref Range   WBC 13.1 (H) 4.0 - 10.5 K/uL   RBC 4.65 4.22 - 5.81 MIL/uL   Hemoglobin 13.1 13.0 - 17.0 g/dL   HCT 96.2 95.2 - 84.1 %   MCV 86.5 80.0 - 100.0 fL   MCH 28.2 26.0 - 34.0 pg   MCHC 32.6 30.0 - 36.0 g/dL   RDW 32.4 40.1 - 02.7 %   Platelets 198 150 - 400 K/uL   nRBC 0.0 0.0 - 0.2 %    Comment: Performed at [facility], [address] [place], [place] [address]  Creatinine, serum     Status: None   Collection Time: 10/27/23  1:27 PM  Result Value Ref Range   Creatinine, Ser 0.96 0.61 - 1.24 mg/dL   GFR, Estimated >44 >03 mL/min    Comment: (NOTE) Calculated using the CKD-EPI Creatinine Equation (2021) Performed at [facility], [address], [place], [place] [address]   Glucose, capillary     Status: None   Collection Time: 11/19/23  3:12 PM  Result Value Ref Range   Glucose-Capillary 90 70 - 99 mg/dL    Comment: Glucose reference range applies only to samples taken after fasting for at least 8 hours.  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 -  A few reactive lymphoid aggregates.  PERIPHERAL BLOOD: -  Overall unremarkable indices and morphologic smear review  Note: It is noted that the patient has a history of an atypical lymphoid infiltrate and a thoracic epidermal mass.  In addition, a Bcl-2 gene rearrangement was identified in low percentage.  While the current biopsy highlights a few lymphoid aggregates the immunohistochemical staining pattern is reactive in nature.  In addition, flow cytometry showed no immunophenotypic evidence of  a lymphoproliferative disorder.  MICROSCOPIC DESCRIPTION:  PERIPHERAL BLOOD SMEAR: The peripheral blood smear and indices are revie wed.  There is no significant abnormality in either the indices or morphologic smear review.  BONE MARROW ASPIRATE: Bone marrow aspirate smear slides contain several cellular bone marrow spicules which are adequate for interpretation. Erythroid precursors: Erythroid series is present in relatively increased number but with otherwise orderly maturation and unremarkable morphology. Granulocytic precursors: The myeloid series is present in relatively decreased number (M: E ratio = 0.68) both otherwise orderly maturation and unremarkable morphology. Megakaryocytes: The megakaryocytes are present in adequate to slightly increased number but with unremarkable morphology. Lymphocytes/plasma cells: Lymphocytes and plasma cells are not increased and are small round and mature.  TOUCH PREPARATIONS: The touch prep  shows similar but suboptimal morphology compared to the aspirate smear slides, refer to above.  CLOT AND BIOPSY: The decalcified bone marrow biopsy consists of a core  of cortical and trabecular bone with extensive aspiration artifact but with sufficient hematopoietic marrow for evaluation (i.e. adequate for interpretation).  The cellularity ranges from 10 to 60% within overall cellularity of approximately 50% there are focal lymphoid aggregates and a slightly paratrabecular location, refer to IHC.  The hematopoietic marrow itself is otherwise unremarkable with orderly trilineage hematopoiesis.  The clot section consists of fragments of hematopoietic marrow with a cellularity of approximately 50% and shows orderly trilineage hematopoiesis.  Lymphoid aggregates are not prominently present on the clot section.  IMMUNOHISTOCHEMICAL STAINS: CD138 and kappa/lambda ISH were performed on both the biopsy and clot section and show polyclonal plasma cells with a normal immuno architecture..  Given the lymphoid aggregates on the biopsy CD20 and CD3 were performed on the biopsy and highlight the aggregates which shows a rim of PAX5/CD20 positi ve B cells around a centralized core of predominantly small CD3/CD5 positive T cells.  CD10 is essentially negative in the lymphocytes.  Cyclin D1 is negative. These are interpreted as reactive lymphoid Plavix.  IRON STAIN: Iron stains are performed on a bone marrow aspirate or touch imprint smear and section of clot. The controls stained appropriately.       Storage Iron: Adequate histiocytic iron stores.      Ring Sideroblasts: Not identified  ADDITIONAL DATA/TESTING: Conventional cytogenetics are pending and will be reported separately.  CELL COUNT DATA:  Bone Marrow count performed on 500 cells shows: Blasts:   0%   Myeloid:  34% Promyelocytes: 0%   Erythroid:     50% Myelocytes:    11%  Lymphocytes:   8% Metamyelocytes:     0%   Plasma cells:   3% Bands:    2% Neutrophils:   19%  M:E ratio:     0.68 Eosinophils:   2% Basophils:     0% Monocytes:     5%  Lab Data: CBC performed on 12/31/2023 shows: WBC: 4.9 k/uL  Neutrophils:   56% Hgb: 13.5 g/dL Lymphocyt es:   40% HCT: 42.1 %    Monocytes:     10% MCV: 88.4 fL   Eosinophils:   0% RDW: 13.2 %    Basophils:     0% PLT: 193 k/uL    GROSS DESCRIPTION:  A.  BM-aspirate smear  B.  Received in B-plus fixative is a 1.0 x 0.6 x 0.2 cm aggregate of tissue.  Submitted entirely in B1.  C.  Received in B-plus fixative is a single core of tan, from bone measuring 1.8 cm in length by 0.2 cm in diameter.  Submitted entirely in C1, following decalcification in Immunocal. (KW, 12/31/2023)   Final Diagnosis performed by Orene Desanctis DO.   Electronically signed 01/03/2024 Technical and / or Professional components performed at [facility], [address], [place], [place] [address].  Immunohistochemistry Technical component (if applicable) was performed at [facility]. [address], [place], [place] [address].   IMMUNOHISTOCHEMISTRY DISCLAIMER (if applicable): Some of these immunohistochemical stains may have been developed and th e performance characteristics determine by [facility]. Some may not have been cleared or approved by the U.S. Food and Drug Administration. The FDA has determined that such clearance or approval is not necessary. This test is used for clinical purposes. It should not be regarded as investigational or for research. This laboratory is certified under the Clinical Laboratory Improvement Amendments of 1988 (CLIA-88) as qualified to perform high complexity clinical laboratory testing.  The controls stained appropriately.   IHC stains are performed on formalin fixed, paraffin embedded tissue using a 3,3"diaminobenzidine (DAB) chromogen and Leica Bond Autostainer System. The staining intensity of  the nucleus is score manually and is reported as the percentage of tumor cell nuclei demonstrating specific nuclear staining. The specimens are fixed in 10% Neutral Formalin for at least 6 hours and up to 72hrs. These tests are validated on decalcified tissue. Results sho uld be interpreted with caution given the possibility of false negative results on decalcified specimens. Antibody Clones are as follows ER-clone 25F, PR-clone 16, Ki67- clone MM1. Some of these immunohistochemical stains may have been developed and the performance characteristics determined by [facility].   Surgical pathology     Status: None   Collection Time: 12/31/23 12:00 AM  Result Value Ref Range   SURGICAL PATHOLOGY      Surgical Pathology CASE: [id number] PATIENT: Charles Barrera Flow Pathology Report     Clinical history: lymphoma vs myeloma     DIAGNOSIS:  Bone marrow, aspirate, flow cytometry: -  No immunophenotypic evidence of a lymphoproliferative disorder (i.e. no monoclonal B cells or immunophenotypically abnormal T cells detected). -  1% CD34 positive blasts/hematogones.  GATING AND PHENOTYPIC ANALYSIS:  Gated population: Flow cytometric immunophenotyping is performed using antibodies to the antigens listed in the table below. Electronic gates are placed around a cell cluster displaying light scatter properties corresponding to: lymphocytes, blasts  Abnormal Cells in gated population: N/A  Phenotype of Abnormal Cells: N/A                      Lymphoid Antigens       Myeloid Antigens Miscellaneous CD2  tested    CD10 tested    CD11b     ND   CD45 tested CD3  tested    CD19 tested    CD11c     ND   HLA-Dr    ND CD4  tested    CD20 tested    CD13 ND   CD34 test ed CD5  tested    CD22 ND   CD14 ND   CD38 tested CD7  tested    CD79b     ND   CD15 ND   CD138     ND CD8  tested    CD103     ND   CD16 ND   TdT  ND CD25 ND   CD200     tested    CD33 ND   CD123     ND TCRab  ND   sKappa    tested    CD64 ND   CD41 ND TCRgd     tested    sLambda   tested    CD117     ND   CD61 ND CD56 tested    cKappa    ND   MPO  ND   CD71 ND CD57 ND   cLambda   ND        [id number]      GROSS DESCRIPTION:  Reference Bone Marrow case [id number].    Final Diagnosis performed by Orene Desanctis DO.   Electronically signed 01/03/2024 Technical and / or Professional components performed at [facility], [address], [place], [place] [address].  The above tests were developed and their performance characteristics determined by the [facility] system for the physical and immunophenotypic characterization of cell populations. They have not been cleared by the U.S. Food and Drug administration. The  FDA has determined that such  clearance or approval is not necessary. This test is used for clinical purposes. It should not be  regarded as investigational or for research   CBC with Differential/Platelet     Status: None   Collection Time: 12/31/23  7:49 AM  Result Value Ref Range   WBC 4.9 4.0 - 10.5 K/uL   RBC 4.76 4.22 - 5.81 MIL/uL   Hemoglobin 13.5 13.0 - 17.0 g/dL   HCT 78.2 95.6 - 21.3 %   MCV 88.4 80.0 - 100.0 fL   MCH 28.4 26.0 - 34.0 pg   MCHC 32.1 30.0 - 36.0 g/dL   RDW 08.6 57.8 - 46.9 %   Platelets 193 150 - 400 K/uL   nRBC 0.0 0.0 - 0.2 %   Neutrophils Relative % 53 %   Neutro Abs 2.6 1.7 - 7.7 K/uL   Lymphocytes Relative 34 %   Lymphs Abs 1.7 0.7 - 4.0 K/uL   Monocytes Relative 11 %   Monocytes Absolute 0.5 0.1 - 1.0 K/uL   Eosinophils Relative 1 %   Eosinophils Absolute 0.1 0.0 - 0.5 K/uL   Basophils Relative 1 %   Basophils Absolute 0.0 0.0 - 0.1 K/uL   Immature Granulocytes 0 %   Abs Immature Granulocytes 0.01 0.00 - 0.07 K/uL    Comment: Performed at [facility], [address], [place], [place] [address]     RADIOGRAPHIC STUDIES:  I have personally reviewed the radiological images as listed and agree  with the findings in the report.  CT BONE MARROW BIOPSY & ASPIRATION Result Date: 12/31/2023 INDICATION: Patient with suspected lymphoma versus myeloma with metastatic disease to bones and increased bone marrow activity on scans. Needs biopsy confirmation EXAM: CT GUIDED BONE MARROW ASPIRATION AND CORE BIOPSY MEDICATIONS: 25 mg Benadryl ANESTHESIA/SEDATION: Moderate (conscious) sedation was employed during this procedure. A total of Versed 4 mg and Fentanyl 100 mcg was administered intravenously. Moderate Sedation Time: 17 minutes. The patient's level of consciousness and vital signs were monitored continuously by radiology nursing throughout the procedure under my direct supervision. FLUOROSCOPY TIME:  CT dose; 225 mGycm COMPLICATIONS: None immediate. Estimated blood loss: <5 mL PROCEDURE: RADIATION DOSE REDUCTION: This exam was performed according to the departmental dose-optimization program which includes automated exposure control, adjustment of the mA and/or kV according to patient size and/or use of iterative reconstruction technique. Informed written consent was obtained from the patient after a thorough discussion of the procedural risks, benefits and alternatives. All questions were addressed. Maximal Sterile  Barrier Technique was utilized including caps, mask, sterile gowns, sterile gloves, sterile drape, hand hygiene and skin antiseptic. A timeout was performed prior to the initiation of the procedure. The patient was positioned prone and non-contrast localization CT was performed of the pelvis to demonstrate the iliac marrow spaces. Maximal barrier sterile technique utilized including caps, mask, sterile gowns, sterile gloves, large sterile drape, hand hygiene, and chlorhexidine prep. Under sterile conditions and local anesthesia, an 11 gauge coaxial bone biopsy needle was advanced into the RIGHT iliac marrow space. Needle position was confirmed with CT imaging. Initially, bone marrow aspiration was  performed. Next, the 11 gauge outer cannula was utilized to obtain a 1 iliac bone marrow core biopsy. Needle was removed. Hemostasis was obtained with compression. The patient tolerated the procedure well. Samples were prepared with the cytotechnologist. IMPRESSION: Successful CT-guided bone marrow aspiration and biopsy Roanna Banning, MD [facility] Electronically Signed   By: Roanna Banning M.D.   On: 12/31/2023 12:14     I discussed the assessment and treatment plan with the patient. The patient was provided an opportunity to ask questions and all were answered. The patient agreed with the plan and demonstrated an understanding of the instructions. The patient was advised to call back or seek an in-person evaluation if the symptoms worsen or if the condition fails to improve as anticipated.    I spent 25 minutes over the phone during this appointment reviewing test results, discuss management and coordination of care.  Meryl Crutch, MD 01/09/2024 4:20 PM [facility] MED [facility] - A DEPT OF [facility] [address] [place] [address] Dept: [phone] Dept Fax: [phone]   Future Appointments  Date Time Provider Department Center  01/14/2024  1:15 PM Ashley Murrain, PT [facility]  01/16/2024  1:15 PM Ashley Murrain, PT [facility]    This document was completed utilizing speech recognition software. Grammatical errors, random word insertions, pronoun errors, and incomplete sentences are an occasional consequence of this system due to software limitations, ambient noise, and hardware issues. Any formal questions or concerns about the content, text or information contained within the body of this dictation should be directly addressed to the provider for clarification.

## 2024-01-10 ENCOUNTER — Encounter: Payer: Self-pay | Admitting: Oncology

## 2024-01-13 NOTE — Therapy (Signed)
OUTPATIENT PHYSICAL THERAPY THORACOLUMBAR TREATMENT   Patient Name: Charles Barrera MRN: [id number] DOB:06/11/1981, 43 y.o., male Today's Date: 01/14/2024  END OF SESSION:  PT End of Session - 01/14/24 1316     Visit Number 6    Date for PT Re-Evaluation 02/17/24    Authorization Type BCBS    PT Start Time 1316    PT Stop Time 1356    PT Time Calculation (min) 40 min    Activity Tolerance Patient tolerated treatment well                  History reviewed. No pertinent past medical history. Past Surgical History:  Procedure Laterality Date   FLEXIBLE SIGMOIDOSCOPY N/A 10/27/2023   Procedure: FLEXIBLE SIGMOIDOSCOPY;  Surgeon: Jeani Hawking, MD;  Location: [facility] ENDOSCOPY;  Service: Gastroenterology;  Laterality: N/A;   LAMINECTOMY N/A 10/25/2023   Procedure: Thoracic four-thoracic six laminectomy for resection of epidural mass;  Surgeon: Bedelia Person, MD;  Location: [facility] OR;  Service: Neurosurgery;  Laterality: N/A;   Patient Active Problem List   Diagnosis Date Noted   Abnormal CT scan, colon 11/08/2023   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   Lytic bone lesions on xray 10/23/2023    PCP: Caffie Damme, MD   REFERRING PROVIDER: Bedelia Person, MD   REFERRING DIAG: [phone] (ICD-10-CM) - Spondylosis with myelopathy, thoracic region  Rationale for Evaluation and Treatment: Rehabilitation  THERAPY DIAG:  Muscle weakness (generalized)  Unsteadiness on feet  Pain in thoracic spine  ONSET DATE: 10/25/23 DOS T4-6 laminectomy for resection of epidural mass  SUBJECTIVE:                                                                                                                                                                                           SUBJECTIVE STATEMENT: Continues to report slow improvement. Weakness in legs primary complaint today, no significant changes. Mild back pain he attributes to sitting at work.    PERTINENT HISTORY:   unremarkable  PAIN:  Are you having pain? Yes: NPRS scale: 3/10 Pain location: mid and low back Pain description: pressure Aggravating factors: prolonged standing Relieving factors: siting or changing positions  PRECAUTIONS: none  RED FLAGS: None   WEIGHT BEARING RESTRICTIONS: No  FALLS:  Has patient fallen in last 6 months? 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  DIAGNOSTIC FINDINGS:  12/05/23 MRI IMPRESSION: 1. 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 2. T2 hyperintensity in the cord at the level of prior surgery, likely post compressive. No residual cord mass effect. 3. Unchanged widespread marrow infiltration, correlate with pathology.  PATIENT SURVEYS:  Modified Oswestry 12 / 50 = 24.0 %   COGNITION: Overall cognitive status: Within functional limits for tasks assessed     SENSATION: Not full sensation back but better than prior to surgery  MUSCLE LENGTH: Quads L> R, marked HS, piriformis and gastroc B   POSTURE: rounded shoulders, forward head, and flexed trunk   PALPATION: Tender and tight in bil thoracic paraspinals around incision  LUMBAR ROM: mild tightness with rotation B  AROM eval  Flexion   Extension   Right lateral flexion   Left lateral flexion   Right rotation   Left rotation    (Blank rows = not tested)  LOWER EXTREMITY MMT:     MMT  Right eval Left eval  Hip flexion 4+ 4  Hip extension 5 4+  Hip abduction 4 4-  Hip adduction 5 5  Hip  internal rotation    Hip external rotation    Knee flexion 4 5  Knee extension 5 5  Ankle dorsiflexion 5 5  Ankle plantarflexion    Ankle inversion    Ankle eversion     (Blank rows = not tested)  LOWER EXTREMITY ROM:  WFL, but limited by flexibility deficits   FUNCTIONAL TESTS:  5 times sit to stand: 11.53 sec Berg Balance Scale: 50/56 Dynamic Gait Index: 22/24 on 12/26/23  GAIT: Distance walked: 20 Assistive device utilized: Single point cane Level of assistance: Modified independence Comments: slow gait, wide BOS  TREATMENT DATE:                                                                                                                               [facility] Adult PT Treatment:                                                DATE: 01/14/24 Therapeutic Exercise: Squat tap to chair + foam pad 2x10 cues for pacing 8 inch lateral step up x8 BIL cues for pacing Green band hamstring curl, seated 2x12 BIL  Neuromuscular re-ed: 8inch step tap 2x10 BIL no UE support cues for appropriate weight shift, single limb stability, and stepping response as needed SBA/CGA Lateral 8 inch step tap 2x10 BIL LE, no UE support, cues for single limb stability, CGA weaning to supervision Tandem walk, no UE support (CGA weaning to close supervision) 4x52ft  [facility] Adult PT Treatment:                                                DATE: 01/07/24 Therapeutic Exercise: STS 2 x 5  no UE support Functional squat 2 x 10 to chair +foam Standing HS curl 5# 2x10 B Standing hip ABD and EXT 5#AW 2x10 8 inch step taps no UE support 5#AW 2x10 B Step ups fwd  8 inch step 5# AW x 5 B , then 5 on L with no weight Step ups lateral  R 8 inch step x 10 B - step down on L is shaky Wall sits partial x 2-3, but patient felt his legs were going to give out. Tandem balance B; tandem balance with head turns Tandem walking   [facility] Adult PT Treatment:                                                DATE:  01/02/24 Therapeutic Exercise: Blue Tband rows and ext x 20 ea 8 inch step taps no UE support 2x10 B Step ups fwd  8 inch step x 10 B  Step ups lateral 6 inch step x 10 B  STS 2 x 5  no UE support Functional squat x 10 to chair +foam Red band at ankles hip 45 deg kickback 2x10   [facility] Adult PT Treatment:                                                DATE: 12/30/23 Therapeutic Exercise: 8 inch step taps no UE support 2x10 BIL Red band hip abd 2x8 BIL cues for posture Red band hip ext 2x8 BIL cues for posture Red band hip 45 deg kickback 2x5 STS from chair x8 no UE support     12/26/23 DGI completed Standing foot taps to 8 inch step x 10 B SBA Step up with alt knee raise x 10 ea with UE support Standing march 2 x 10 ea (harder than step up with knee raise) Standing hip ABD 2x10 Standing hip ext x 10 ea Leg press 70# B x 15, unilateral 40# x 15 ea Sit to stand black band around thighs x 10 Sidestepping 4 x 10 black band  Monster walk 2 x 10 black band  Thoracic extension in chair x 10   12/23/23  See pt ed and HEP    PATIENT EDUCATION:  Education details: rationale for interventions, HEP  Person educated: Patient Education method: Explanation, Demonstration, Tactile cues, Verbal cues Education comprehension: verbalized understanding, returned demonstration, verbal cues required, tactile cues required, and needs further education     HOME EXERCISE PROGRAM: Access Code: [email or web address] URL: [email or web address] Date: 12/26/2023 Prepared by: Raynelle Fanning  Exercises - Seated Piriformis Stretch with Trunk Bend  - 2 x daily - 7 x weekly - 1 sets - 3 reps - 15-30 hold - Sidelying Thoracic Rotation with Open Book  - 1 x daily - 7 x weekly - 2 sets - 5 reps - Child's Pose Stretch  - 1 x daily - 7 x weekly - 1 sets -  3 reps - 20-30 sec hold - Child's Pose with Sidebending  - 2 x daily - 7 x weekly - 1 sets - 2 reps - 20-30 sec hold - Step Taps on High Step  - 1 x daily - 7 x  weekly - 1 sets - 10 reps - Side Stepping with Resistance at Thighs  - 1 x daily - 3-4 x weekly - 2 sets - 10 reps - Forward Monster Walk with Resistance (BKA)  - 1 x daily - 3-4 x weekly - 2 sets - 10 reps - Standing Marching  - 1 x daily - 3-4 x weekly - 2-3 sets - 10 reps - Standing Hip Extension with Counter Support  - 1 x daily - 3-4 x weekly - 2-3 sets - 10 reps - Standing Hip Abduction with Counter Support  - 1 x daily - 3-4 x weekly - 2-3 sets - 10 reps  ASSESSMENT:  CLINICAL IMPRESSION: 01/14/2024 Pt arrives w/ 3/10 pain from sitting at work earlier, no issues after last session. Today continuing to work on LE strengthening and postural stability with emphasis on single limb stability and controlling weight shift which pt does well with. Cues as above. No adverse events, tolerates session well with muscular fatigue. Recommend continuing along current POC in order to address relevant deficits and improve functional tolerance. Pt departs today's session in no acute distress, all voiced questions/concerns addressed appropriately from PT perspective.     Eval: Patient is a 43 y.o. male who was seen today for physical therapy evaluation and treatment for thoracic spine pain and LE weakness s/p T4-6 laminectomy due to epidural mass. He also reports balance deficits and has been using a cane since October. All tests have been negative for cancer. He has a bone marrow test scheduled for early January. He demonstrates B LE weakness, marked flexibility deficits and higher level balance deficits. His BERG score of 50/56 puts him at moderate risk for falls. No gait belt required today and no incidences of LOB with gait. He will benefit from skilled PT to address these deficits.    OBJECTIVE IMPAIRMENTS: decreased activity tolerance, decreased balance, difficulty walking, decreased ROM, decreased strength, increased muscle spasms, impaired flexibility, postural dysfunction, and pain.   ACTIVITY  LIMITATIONS: carrying, lifting, bending, standing, stairs, and locomotion level  PARTICIPATION LIMITATIONS: cleaning, shopping, community activity, occupation, and yard work  PERSONAL FACTORS: Fitness, Profession, and 1 comorbidity: myelopathy  are also affecting patient's functional outcome.   REHAB POTENTIAL: Good  CLINICAL DECISION MAKING: Evolving/moderate complexity  EVALUATION COMPLEXITY: Moderate   GOALS: Goals reviewed with patient? Yes  SHORT TERM GOALS: Target date: 01/13/2024   Patient will be independent with initial HEP. Baseline:  01/14/24: reports good HEP performance Goal status: MET   2. DGI completed to assess fall risk. Baseline:  Goal status: MET   LONG TERM GOALS: Target date: 02/17/2024  Patient will be independent with advanced/ongoing HEP to improve outcomes and carryover.  Baseline:  Goal status: INITIAL  2.  Patient will be able to ambulate 600' in 6 minutes without AD with good safety to access community.  Baseline:  Goal status: INITIAL  3.  Patient will be able to step up/down curb safely without AD with good safety.  Baseline:  Goal status: INITIAL   4.  Patient will demonstrate decreased 5XSTS by 2-3 seconds showing improved functional strength. Baseline: 11.53 sec Goal status: INITIAL  5.  Patient will demonstrate at least 19/24 on DGI to improve gait  stability and reduce risk for falls. Baseline: TBD Goal status: MET  6.  Patient will score 56 on Berg Balance test to demonstrate lower risk of falls. (MCID= 8 points) .  Baseline: 50 Goal status: INITIAL  7.  Patient will report 6 on Modified Oswestry to demonstrate improved functional ability. Baseline: 12 / 50 = 24.0 % Goal status: INITIAL  8.  Patient will report decreased back pain by 75% to improved QOL. Baseline:  Goal status: INITIAL    PLAN:  PT FREQUENCY: 2x/week  PT DURATION: 8 weeks  PLANNED INTERVENTIONS: 97164- PT Re-evaluation, 97110-Therapeutic exercises,  97530- Therapeutic activity, 97112- Neuromuscular re-education, 97535- Self Care, 40981- Manual therapy, [phone]- Gait training, [phone]- Aquatic Therapy, [phone]- Electrical stimulation (unattended), Patient/Family education, Balance training, Stair training, Taping, Dry Needling, Joint mobilization, Spinal mobilization, Scar mobilization, Cryotherapy, and Moist heat.  PLAN FOR NEXT SESSION: Work on LE strength, MT prn for thoracic paraspinals.   Ashley Murrain PT, DPT 01/14/2024 2:00 PM

## 2024-01-14 ENCOUNTER — Ambulatory Visit: Payer: 59 | Admitting: Physical Therapy

## 2024-01-14 ENCOUNTER — Encounter: Payer: Self-pay | Admitting: Physical Therapy

## 2024-01-14 DIAGNOSIS — R2681 Unsteadiness on feet: Secondary | ICD-10-CM

## 2024-01-14 DIAGNOSIS — M6281 Muscle weakness (generalized): Secondary | ICD-10-CM | POA: Diagnosis not present

## 2024-01-14 DIAGNOSIS — M546 Pain in thoracic spine: Secondary | ICD-10-CM

## 2024-01-15 NOTE — Therapy (Signed)
OUTPATIENT PHYSICAL THERAPY THORACOLUMBAR TREATMENT   Patient Name: Charles Barrera MRN: [id number] DOB:11/09/1981, 43 y.o., male Today's Date: 01/16/2024  END OF SESSION:  PT End of Session - 01/16/24 1307     Visit Number 7    Date for PT Re-Evaluation 02/17/24    Authorization Type BCBS    PT Start Time 1307    PT Stop Time 1332    PT Time Calculation (min) 25 min    Activity Tolerance Patient tolerated treatment well             History reviewed. No pertinent past medical history. Past Surgical History:  Procedure Laterality Date   FLEXIBLE SIGMOIDOSCOPY N/A 10/27/2023   Procedure: FLEXIBLE SIGMOIDOSCOPY;  Surgeon: Jeani Hawking, MD;  Location: [facility] ENDOSCOPY;  Service: Gastroenterology;  Laterality: N/A;   LAMINECTOMY N/A 10/25/2023   Procedure: Thoracic four-thoracic six laminectomy for resection of epidural mass;  Surgeon: Bedelia Person, MD;  Location: [facility] OR;  Service: Neurosurgery;  Laterality: N/A;   Patient Active Problem List   Diagnosis Date Noted   Abnormal CT scan, colon 11/08/2023   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   Lytic bone lesions on xray 10/23/2023    PCP: Caffie Damme, MD   REFERRING PROVIDER: Bedelia Person, MD   REFERRING DIAG: [phone] (ICD-10-CM) - Spondylosis with myelopathy, thoracic region  Rationale for Evaluation and Treatment: Rehabilitation  THERAPY DIAG:  Muscle weakness (generalized)  Unsteadiness on feet  Pain in thoracic spine  ONSET DATE: 10/25/23 DOS T4-6 laminectomy for resection of epidural mass  SUBJECTIVE:                                                                                                                                                                                           SUBJECTIVE STATEMENT: Pt denies pain at present. Felt some fatigue after last session but no issues. No other new updates  PERTINENT HISTORY:  unremarkable  PAIN:  Are you having pain? Yes: NPRS scale: none  today Pain location: mid and low back Pain description: pressure Aggravating factors: prolonged standing Relieving factors: siting or changing positions  PRECAUTIONS: none  RED FLAGS: None   WEIGHT BEARING RESTRICTIONS: No  FALLS:  Has patient fallen in last 6 months? 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  DIAGNOSTIC FINDINGS:  12/05/23 MRI IMPRESSION: 1. 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 2. T2 hyperintensity in the cord at the level of prior surgery, likely post compressive. No residual cord mass effect. 3. Unchanged widespread marrow infiltration, correlate with pathology.  PATIENT SURVEYS:  Modified Oswestry 12 / 50 = 24.0 %   COGNITION: Overall cognitive status: Within functional limits for tasks assessed     SENSATION: Not full sensation back but better than prior to surgery  MUSCLE LENGTH: Quads L> R, marked HS, piriformis and gastroc B   POSTURE: rounded shoulders, forward head, and flexed trunk   PALPATION: Tender and tight in bil thoracic paraspinals around incision  LUMBAR ROM: mild tightness with rotation B  AROM eval  Flexion   Extension   Right lateral flexion   Left lateral flexion   Right rotation   Left rotation    (Blank rows = not tested)  LOWER EXTREMITY MMT:     MMT  Right eval Left eval  Hip flexion 4+ 4  Hip extension 5 4+  Hip abduction 4 4-  Hip adduction 5 5  Hip internal rotation    Hip external rotation    Knee flexion 4 5   Knee extension 5 5  Ankle dorsiflexion 5 5  Ankle plantarflexion    Ankle inversion    Ankle eversion     (Blank rows = not tested)  LOWER EXTREMITY ROM:  WFL, but limited by flexibility deficits   FUNCTIONAL TESTS:  5 times sit to stand: 11.53 sec Berg Balance Scale: 50/56 Dynamic Gait Index: 22/24 on 12/26/23  GAIT: Distance walked: 20 Assistive device utilized: Single point cane Level of assistance: Modified independence Comments: slow gait, wide BOS  TREATMENT DATE:                                                                                                                               [facility] Adult PT Treatment:                                                DATE: 01/16/24 Therapeutic Exercise: BW squat 2x10 2 bouts Superset  step tap  BIL x8 Fwd/retro walking along counter 4 laps cues for posture w/ retro and velocity w/ fwd 2 bouts superset  Quick march w/ stance hold x5 BIL Seated green band hamstring curl x10 BIL    [facility] Adult PT Treatment:  DATE: 01/14/24 Therapeutic Exercise: Squat tap to chair + foam pad 2x10 cues for pacing 8 inch lateral step up x8 BIL cues for pacing Green band hamstring curl, seated 2x12 BIL  Neuromuscular re-ed: 8inch step tap 2x10 BIL no UE support cues for appropriate weight shift, single limb stability, and stepping response as needed SBA/CGA Lateral 8 inch step tap 2x10 BIL LE, no UE support, cues for single limb stability, CGA weaning to supervision Tandem walk, no UE support (CGA weaning to close supervision) 4x67ft     [facility] Adult PT Treatment:                                                DATE: 01/07/24 Therapeutic Exercise: STS 2 x 5  no UE support Functional squat 2 x 10 to chair +foam Standing HS curl 5# 2x10 B Standing hip ABD and EXT 5#AW 2x10 8 inch step taps no UE support 5#AW 2x10 B Step ups fwd  8 inch step 5# AW x 5 B , then 5 on L with no weight Step ups lateral  R  8 inch step x 10 B - step down on L is shaky Wall sits partial x 2-3, but patient felt his legs were going to give out. Tandem balance B; tandem balance with head turns Tandem walking   [facility] Adult PT Treatment:                                                DATE: 01/02/24 Therapeutic Exercise: Blue Tband rows and ext x 20 ea 8 inch step taps no UE support 2x10 B Step ups fwd  8 inch step x 10 B  Step ups lateral 6 inch step x 10 B  STS 2 x 5  no UE support Functional squat x 10 to chair +foam Red band at ankles hip 45 deg kickback 2x10   [facility] Adult PT Treatment:                                                DATE: 12/30/23 Therapeutic Exercise: 8 inch step taps no UE support 2x10 BIL Red band hip abd 2x8 BIL cues for posture Red band hip ext 2x8 BIL cues for posture Red band hip 45 deg kickback 2x5 STS from chair x8 no UE support     12/26/23 DGI completed Standing foot taps to 8 inch step x 10 B SBA Step up with alt knee raise x 10 ea with UE support Standing march 2 x 10 ea (harder than step up with knee raise) Standing hip ABD 2x10 Standing hip ext x 10 ea Leg press 70# B x 15, unilateral 40# x 15 ea Sit to stand black band around thighs x 10 Sidestepping 4 x 10 black band  Monster walk 2 x 10 black band  Thoracic extension in chair x 10   12/23/23  See pt ed and HEP    PATIENT EDUCATION:  Education details: rationale for interventions, HEP  Person educated: Patient Education method: Explanation, Demonstration, Tactile cues, Verbal cues Education comprehension: verbalized understanding, returned demonstration,  verbal cues required, tactile cues required, and needs further education     HOME EXERCISE PROGRAM: Access Code: [email or web address] URL: [email or web address] Date: 12/26/2023 Prepared by: Raynelle Fanning  Exercises - Seated Piriformis Stretch with Trunk Bend  - 2 x daily - 7 x weekly - 1 sets - 3 reps - 15-30 hold - Sidelying Thoracic Rotation with Open Book   - 1 x daily - 7 x weekly - 2 sets - 5 reps - Child's Pose Stretch  - 1 x daily - 7 x weekly - 1 sets - 3 reps - 20-30 sec hold - Child's Pose with Sidebending  - 2 x daily - 7 x weekly - 1 sets - 2 reps - 20-30 sec hold - Step Taps on High Step  - 1 x daily - 7 x weekly - 1 sets - 10 reps - Side Stepping with Resistance at Thighs  - 1 x daily - 3-4 x weekly - 2 sets - 10 reps - Forward Monster Walk with Resistance (BKA)  - 1 x daily - 3-4 x weekly - 2 sets - 10 reps - Standing Marching  - 1 x daily - 3-4 x weekly - 2-3 sets - 10 reps - Standing Hip Extension with Counter Support  - 1 x daily - 3-4 x weekly - 2-3 sets - 10 reps - Standing Hip Abduction with Counter Support  - 1 x daily - 3-4 x weekly - 2-3 sets - 10 reps  ASSESSMENT:  CLINICAL IMPRESSION: 01/16/2024 Pt arrives w/o pain, no issues after last session. Today's visit shortened per pt request as he has a work meeting to get to. Given this, utilizing superset structure to maximize exercise volume which he does well with, endorses some mild fatigue as expected. Pt also notes he has mentioned some feeling of tightness in his stomach/abdominals that has been present since his admission back in November, only occurs while standing. Per chart review it appears he did have CT abdomen and sigmoidoscopy in November. He states it is not worsening but does not seem to be improving much either, doesn't endorse any red flags - encouraged him to monitor and communicate with providers as appropriate. No adverse events, tolerates session well and departs with mild fatigue. Recommend continuing along current POC in order to address relevant deficits and improve functional tolerance. Pt departs today's session in no acute distress, all voiced questions/concerns addressed appropriately from PT perspective.    Eval: Patient is a 43 y.o. male who was seen today for physical therapy evaluation and treatment for thoracic spine pain and LE weakness s/p T4-6  laminectomy due to epidural mass. He also reports balance deficits and has been using a cane since October. All tests have been negative for cancer. He has a bone marrow test scheduled for early January. He demonstrates B LE weakness, marked flexibility deficits and higher level balance deficits. His BERG score of 50/56 puts him at moderate risk for falls. No gait belt required today and no incidences of LOB with gait. He will benefit from skilled PT to address these deficits.    OBJECTIVE IMPAIRMENTS: decreased activity tolerance, decreased balance, difficulty walking, decreased ROM, decreased strength, increased muscle spasms, impaired flexibility, postural dysfunction, and pain.   ACTIVITY LIMITATIONS: carrying, lifting, bending, standing, stairs, and locomotion level  PARTICIPATION LIMITATIONS: cleaning, shopping, community activity, occupation, and yard work  PERSONAL FACTORS: Fitness, Profession, and 1 comorbidity: myelopathy  are also affecting patient's functional outcome.   REHAB POTENTIAL: Good  CLINICAL DECISION MAKING:  Evolving/moderate complexity  EVALUATION COMPLEXITY: Moderate   GOALS: Goals reviewed with patient? Yes  SHORT TERM GOALS: Target date: 01/13/2024   Patient will be independent with initial HEP. Baseline:  01/14/24: reports good HEP performance Goal status: MET   2. DGI completed to assess fall risk. Baseline:  Goal status: MET   LONG TERM GOALS: Target date: 02/17/2024  Patient will be independent with advanced/ongoing HEP to improve outcomes and carryover.  Baseline:  Goal status: INITIAL  2.  Patient will be able to ambulate 600' in 6 minutes without AD with good safety to access community.  Baseline:  Goal status: INITIAL  3.  Patient will be able to step up/down curb safely without AD with good safety.  Baseline:  Goal status: INITIAL   4.  Patient will demonstrate decreased 5XSTS by 2-3 seconds showing improved functional  strength. Baseline: 11.53 sec Goal status: INITIAL  5.  Patient will demonstrate at least 19/24 on DGI to improve gait stability and reduce risk for falls. Baseline: TBD Goal status: MET  6.  Patient will score 56 on Berg Balance test to demonstrate lower risk of falls. (MCID= 8 points) .  Baseline: 50 Goal status: INITIAL  7.  Patient will report 6 on Modified Oswestry to demonstrate improved functional ability. Baseline: 12 / 50 = 24.0 % Goal status: INITIAL  8.  Patient will report decreased back pain by 75% to improved QOL. Baseline:  Goal status: INITIAL    PLAN:  PT FREQUENCY: 2x/week  PT DURATION: 8 weeks  PLANNED INTERVENTIONS: 97164- PT Re-evaluation, 97110-Therapeutic exercises, 97530- Therapeutic activity, 97112- Neuromuscular re-education, 97535- Self Care, 62952- Manual therapy, [phone]- Gait training, [phone]- Aquatic Therapy, [phone]- Electrical stimulation (unattended), Patient/Family education, Balance training, Stair training, Taping, Dry Needling, Joint mobilization, Spinal mobilization, Scar mobilization, Cryotherapy, and Moist heat.  PLAN FOR NEXT SESSION: Work on LE strength, MT prn for thoracic paraspinals.   Ashley Murrain PT, DPT 01/16/2024 1:42 PM

## 2024-01-16 ENCOUNTER — Encounter: Payer: Self-pay | Admitting: Physical Therapy

## 2024-01-16 ENCOUNTER — Ambulatory Visit: Payer: 59 | Admitting: Physical Therapy

## 2024-01-16 DIAGNOSIS — R2681 Unsteadiness on feet: Secondary | ICD-10-CM

## 2024-01-16 DIAGNOSIS — M6281 Muscle weakness (generalized): Secondary | ICD-10-CM

## 2024-01-16 DIAGNOSIS — M546 Pain in thoracic spine: Secondary | ICD-10-CM

## 2024-01-21 NOTE — Therapy (Signed)
OUTPATIENT PHYSICAL THERAPY THORACOLUMBAR TREATMENT   Patient Name: Charles Barrera MRN: [id number] DOB:1981/06/23, 43 y.o., male Today's Date: 01/22/2024  END OF SESSION:  PT End of Session - 01/22/24 1017     Visit Number 8    Date for PT Re-Evaluation 02/17/24    Authorization Type BCBS    PT Start Time 1017    PT Stop Time 1059    PT Time Calculation (min) 42 min    Activity Tolerance Patient tolerated treatment well              History reviewed. No pertinent past medical history. Past Surgical History:  Procedure Laterality Date   FLEXIBLE SIGMOIDOSCOPY N/A 10/27/2023   Procedure: FLEXIBLE SIGMOIDOSCOPY;  Surgeon: Jeani Hawking, MD;  Location: [facility] ENDOSCOPY;  Service: Gastroenterology;  Laterality: N/A;   LAMINECTOMY N/A 10/25/2023   Procedure: Thoracic four-thoracic six laminectomy for resection of epidural mass;  Surgeon: Bedelia Person, MD;  Location: [facility] OR;  Service: Neurosurgery;  Laterality: N/A;   Patient Active Problem List   Diagnosis Date Noted   Abnormal CT scan, colon 11/08/2023   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   Lytic bone lesions on xray 10/23/2023    PCP: Caffie Damme, MD   REFERRING PROVIDER: Bedelia Person, MD   REFERRING DIAG: [phone] (ICD-10-CM) - Spondylosis with myelopathy, thoracic region  Rationale for Evaluation and Treatment: Rehabilitation  THERAPY DIAG:  Muscle weakness (generalized)  Unsteadiness on feet  Pain in thoracic spine  ONSET DATE: 10/25/23 DOS T4-6 laminectomy for resection of epidural mass  SUBJECTIVE:                                                                                                                                                                                           SUBJECTIVE STATEMENT: 01/22/2024 states he has a bit of a knot in mid back, 5/10, started sometime this morning. Felt a bit of fatigue after last session but nothing out of the norm. He reports that his endurance and  balance continues to slowly improve.   PERTINENT HISTORY:  unremarkable  PAIN:  Are you having pain? Yes: NPRS scale: none today Pain location: mid and low back Pain description: pressure Aggravating factors: prolonged standing Relieving factors: siting or changing positions  PRECAUTIONS: none  RED FLAGS: None   WEIGHT BEARING RESTRICTIONS: No  FALLS:  Has patient fallen in last 6 months? 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  DIAGNOSTIC FINDINGS:  12/05/23 MRI IMPRESSION: 1. 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 2. T2 hyperintensity in the cord at the level of prior surgery, likely post compressive. No residual cord mass effect. 3. Unchanged widespread marrow infiltration, correlate with pathology.  PATIENT SURVEYS:  Modified Oswestry 12 / 50 = 24.0 %   COGNITION: Overall cognitive status: Within functional limits for tasks assessed     SENSATION: Not full sensation back but better than prior to surgery  MUSCLE LENGTH: Quads L> R, marked HS, piriformis and gastroc B   POSTURE: rounded shoulders, forward head, and flexed trunk   PALPATION: Tender and tight in bil thoracic paraspinals around incision  LUMBAR ROM: mild tightness with rotation B  AROM eval  Flexion   Extension   Right lateral flexion   Left lateral flexion   Right rotation   Left rotation    (Blank rows = not tested)  LOWER EXTREMITY MMT:     MMT  Right eval Left eval  Hip flexion 4+ 4  Hip  extension 5 4+  Hip abduction 4 4-  Hip adduction 5 5  Hip internal rotation    Hip external rotation    Knee flexion 4 5  Knee extension 5 5  Ankle dorsiflexion 5 5  Ankle plantarflexion    Ankle inversion    Ankle eversion     (Blank rows = not tested)  LOWER EXTREMITY ROM:  WFL, but limited by flexibility deficits   FUNCTIONAL TESTS:  5 times sit to stand: 11.53 sec Berg Balance Scale: 50/56 Dynamic Gait Index: 22/24 on 12/26/23  GAIT: Distance walked: 20 Assistive device utilized: Single point cane Level of assistance: Modified independence Comments: slow gait, wide BOS  TREATMENT DATE:                                                                                                                               [facility] Adult PT Treatment:                                                DATE: 01/22/24 Therapeutic Exercise: Superset BW squat x8, quick marches x8 ; 3 bouts BIL cues for pacing, 1-57min rest breaks between bouts 6 inch step up x8 BIL 45 deg hip kickbacks red band x8 BIL HEP update + education  Manual Therapy: Seated; STM BIL thoracic paraspinals, rhomboids, lower trap  Neuromuscular re-ed: Slow karaokes 4 laps along counter cues for sequencing, mechanics Bosu step ups 2x5 BIL w/ UE support cues for posture and  ankle positioning/stability    [facility] Adult PT Treatment:                                                DATE: 01/16/24 Therapeutic Exercise: BW squat 2x10 2 bouts Superset  step tap  BIL x8 Fwd/retro walking along counter 4 laps cues for posture w/ retro and velocity w/ fwd 2 bouts superset  Quick march w/ stance hold x5 BIL Seated green band hamstring curl x10 BIL    [facility] Adult PT Treatment:                                                DATE: 01/14/24 Therapeutic Exercise: Squat tap to chair + foam pad 2x10 cues for pacing 8 inch lateral step up x8 BIL cues for pacing Green band hamstring curl, seated 2x12 BIL  Neuromuscular re-ed: 8inch step  tap 2x10 BIL no UE support cues for appropriate weight shift, single limb stability, and stepping response as needed SBA/CGA Lateral 8 inch step tap 2x10 BIL LE, no UE support, cues for single limb stability, CGA weaning to supervision Tandem walk, no UE support (CGA weaning to close supervision) 4x34ft    PATIENT EDUCATION:  Education details: rationale for interventions, HEP  Person educated: Patient Education method: Explanation, Demonstration, Tactile cues, Verbal cues Education comprehension: verbalized understanding, returned demonstration, verbal cues required, tactile cues required, and needs further education     HOME EXERCISE PROGRAM: Access Code: [email or web address] URL: [email or web address] Date: 01/22/2024 Prepared by: Fransisco Hertz  Exercises - Step Taps on High Step  - 1 x daily - 7 x weekly - 1 sets - 10 reps - Side Stepping with Resistance at Thighs  - 1 x daily - 3-4 x weekly - 2 sets - 10 reps - Forward Monster Walk with Resistance (BKA)  - 1 x daily - 3-4 x weekly - 2 sets - 10 reps - Standing Hip Extension with Counter Support  - 1 x daily - 3-4 x weekly - 2-3 sets - 10 reps - Standing Hip Abduction with Counter Support  - 1 x daily - 3-4 x weekly - 2-3 sets - 10 reps - Walking Tandem Stance  - 3-4 x weekly - 1 sets - 10 reps - Squat  - 3-4 x weekly - 2-3 sets - 8 reps - Carioca with Counter Support  - 3-4 x weekly - 2-3 sets - 5-8 reps  ASSESSMENT:  CLINICAL IMPRESSION: 01/22/2024 Pt arrives w/ 5/10 pain and report of "knot" in mid back, no issues after last session and overall continuing with slow/steady progress. Initiating with STM to thoracic paraspinals, rhomboids, and low traps with noted muscular tightness, some improvement in symptoms afterwards. Pt reports resolution of "knot" sensation as session goes on. Continuing with superset framework for functional strengthening which pt does well with, increasing volume which induces muscular fatigue as  expected. Ending session with dynamic postural stability work as above, Cytogeneticist w/ repetition. HEP update as above. No adverse events, pt tolerates session well, limited primarily by muscular fatigue. Recommend continuing along current POC in order to address relevant deficits and improve functional tolerance. Pt departs today's session in no acute distress, all voiced questions/concerns addressed appropriately from PT perspective.  Eval: Patient is a 43 y.o. male who was seen today for physical therapy evaluation and treatment for thoracic spine pain and LE weakness s/p T4-6 laminectomy due to epidural mass. He also reports balance deficits and has been using a cane since October. All tests have been negative for cancer. He has a bone marrow test scheduled for early January. He demonstrates B LE weakness, marked flexibility deficits and higher level balance deficits. His BERG score of 50/56 puts him at moderate risk for falls. No gait belt required today and no incidences of LOB with gait. He will benefit from skilled PT to address these deficits.    OBJECTIVE IMPAIRMENTS: decreased activity tolerance, decreased balance, difficulty walking, decreased ROM, decreased strength, increased muscle spasms, impaired flexibility, postural dysfunction, and pain.   ACTIVITY LIMITATIONS: carrying, lifting, bending, standing, stairs, and locomotion level  PARTICIPATION LIMITATIONS: cleaning, shopping, community activity, occupation, and yard work  PERSONAL FACTORS: Fitness, Profession, and 1 comorbidity: myelopathy  are also affecting patient's functional outcome.   REHAB POTENTIAL: Good  CLINICAL DECISION MAKING: Evolving/moderate complexity  EVALUATION COMPLEXITY: Moderate   GOALS: Goals reviewed with patient? Yes  SHORT TERM GOALS: Target date: 01/13/2024   Patient will be independent with initial HEP. Baseline:  01/14/24: reports good HEP performance Goal status: MET   2.  DGI completed to assess fall risk. Baseline:  Goal status: MET   LONG TERM GOALS: Target date: 02/17/2024  Patient will be independent with advanced/ongoing HEP to improve outcomes and carryover.  Baseline:  Goal status: INITIAL  2.  Patient will be able to ambulate 600' in 6 minutes without AD with good safety to access community.  Baseline:  Goal status: INITIAL  3.  Patient will be able to step up/down curb safely without AD with good safety.  Baseline:  Goal status: INITIAL   4.  Patient will demonstrate decreased 5XSTS by 2-3 seconds showing improved functional strength. Baseline: 11.53 sec Goal status: INITIAL  5.  Patient will demonstrate at least 19/24 on DGI to improve gait stability and reduce risk for falls. Baseline: TBD Goal status: MET  6.  Patient will score 56 on Berg Balance test to demonstrate lower risk of falls. (MCID= 8 points) .  Baseline: 50 Goal status: INITIAL  7.  Patient will report 6 on Modified Oswestry to demonstrate improved functional ability. Baseline: 12 / 50 = 24.0 % Goal status: INITIAL  8.  Patient will report decreased back pain by 75% to improved QOL. Baseline:  Goal status: INITIAL    PLAN:  PT FREQUENCY: 2x/week  PT DURATION: 8 weeks  PLANNED INTERVENTIONS: 97164- PT Re-evaluation, 97110-Therapeutic exercises, 97530- Therapeutic activity, 97112- Neuromuscular re-education, 97535- Self Care, 52841- Manual therapy, [phone]- Gait training, [phone]- Aquatic Therapy, [phone]- Electrical stimulation (unattended), Patient/Family education, Balance training, Stair training, Taping, Dry Needling, Joint mobilization, Spinal mobilization, Scar mobilization, Cryotherapy, and Moist heat.  PLAN FOR NEXT SESSION: continue working on multiplanar postural stability. LE resisted exercises w/ emphasis on muscular strength/endurance.    Ashley Murrain PT, DPT 01/22/2024 11:09 AM

## 2024-01-22 ENCOUNTER — Encounter: Payer: Self-pay | Admitting: Physical Therapy

## 2024-01-22 ENCOUNTER — Ambulatory Visit: Payer: 59 | Admitting: Physical Therapy

## 2024-01-22 DIAGNOSIS — M6281 Muscle weakness (generalized): Secondary | ICD-10-CM

## 2024-01-22 DIAGNOSIS — M546 Pain in thoracic spine: Secondary | ICD-10-CM

## 2024-01-22 DIAGNOSIS — R2681 Unsteadiness on feet: Secondary | ICD-10-CM

## 2024-01-27 NOTE — Therapy (Signed)
 OUTPATIENT PHYSICAL THERAPY THORACOLUMBAR TREATMENT   Patient Name: Charles Barrera MRN: [id number] DOB:1981/10/09, 43 y.o., male Today's Date: 01/28/2024  END OF SESSION:  PT End of Session - 01/28/24 1022     Visit Number 9    Date for PT Re-Evaluation 02/17/24    Authorization Type BCBS    PT Start Time 1022   late check in   PT Stop Time 1101    PT Time Calculation (min) 39 min    Activity Tolerance Patient tolerated treatment well               History reviewed. No pertinent past medical history. Past Surgical History:  Procedure Laterality Date   FLEXIBLE SIGMOIDOSCOPY N/A 10/27/2023   Procedure: FLEXIBLE SIGMOIDOSCOPY;  Surgeon: Rollin Dover, MD;  Location: [facility] ENDOSCOPY;  Service: Gastroenterology;  Laterality: N/A;   LAMINECTOMY N/A 10/25/2023   Procedure: Thoracic four-thoracic six laminectomy for resection of epidural mass;  Surgeon: Debby Dorn MATSU, MD;  Location: [facility] OR;  Service: Neurosurgery;  Laterality: N/A;   Patient Active Problem List   Diagnosis Date Noted   Abnormal CT scan, colon 11/08/2023   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   Lytic bone lesions on xray 10/23/2023    PCP: Claudene Round, MD   REFERRING PROVIDER: Debby Dorn MATSU, MD   REFERRING DIAG: [phone] (ICD-10-CM) - Spondylosis with myelopathy, thoracic region  Rationale for Evaluation and Treatment: Rehabilitation  THERAPY DIAG:  Muscle weakness (generalized)  Unsteadiness on feet  ONSET DATE: 10/25/23 DOS T4-6 laminectomy for resection of epidural mass  SUBJECTIVE:                                                                                                                                                                                           SUBJECTIVE STATEMENT: 01/28/2024 pt states his walking is improving, using cane less around house. Similar fatigue after last session. No other new updates   PERTINENT HISTORY:  unremarkable  PAIN:  Are you having pain?  Yes: NPRS scale: none today  Pain location: mid and low back Pain description: pressure Aggravating factors: prolonged standing Relieving factors: siting or changing positions  PRECAUTIONS: none  RED FLAGS: None   WEIGHT BEARING RESTRICTIONS: No  FALLS:  Has patient fallen in last 6 months? 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  DIAGNOSTIC FINDINGS:  12/05/23 MRI IMPRESSION: 1. 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 2. T2 hyperintensity in the cord at the level of prior surgery, likely post compressive. No residual cord mass effect. 3. Unchanged widespread marrow infiltration, correlate with pathology.  PATIENT SURVEYS:  Modified Oswestry 12 / 50 = 24.0 %   COGNITION: Overall cognitive status: Within functional limits for tasks assessed     SENSATION: Not full sensation back but better than prior to surgery  MUSCLE LENGTH: Quads L> R, marked HS, piriformis and gastroc B   POSTURE: rounded shoulders, forward head, and flexed trunk   PALPATION: Tender and tight in bil thoracic paraspinals around incision  LUMBAR ROM: mild tightness with rotation B  AROM eval  Flexion   Extension   Right lateral flexion   Left lateral flexion   Right rotation   Left rotation    (Blank rows = not tested)  LOWER EXTREMITY MMT:     MMT  Right eval Left eval  Hip flexion 4+ 4  Hip extension 5 4+  Hip abduction 4 4-  Hip adduction 5 5  Hip internal rotation    Hip external rotation     Knee flexion 4 5  Knee extension 5 5  Ankle dorsiflexion 5 5  Ankle plantarflexion    Ankle inversion    Ankle eversion     (Blank rows = not tested)  LOWER EXTREMITY ROM:  WFL, but limited by flexibility deficits   FUNCTIONAL TESTS:  5 times sit to stand: 11.53 sec Berg Balance Scale: 50/56 Dynamic Gait Index: 22/24 on 12/26/23  GAIT: Distance walked: 20 Assistive device utilized: Single point cane Level of assistance: Modified independence Comments: slow gait, wide BOS  TREATMENT DATE:                                                                                                                               [facility] Adult PT Treatment:                                                DATE: 01/28/24 Therapeutic Exercise: Superset 2 bouts; BW squat x8 (quick up, slow down) w/ heel raises x 8 (quick up, slow down) Red band hip abduction/diagonal/extension; standing, 3x5 each way HEP update + education  Neuromuscular re-ed: Superset 2 bouts bosu mini lunge x5 BIL, cone tap x8 BIL for improved postural stability and muscular fatigue 2 inch step hip drop/hike 2x8 BIL w/ UE support for lateral hip activation, pelvic control, single limb stability  Therapeutic Activity: Walking 2 laps between each superset to improve activity tolerance, cues for pacing and mechanics   [facility]  Adult PT Treatment:                                                DATE: 01/22/24 Therapeutic Exercise: Superset BW squat x8, quick marches x8 ; 3 bouts BIL cues for pacing, 1-33min rest breaks between bouts 6 inch step up x8 BIL 45 deg hip kickbacks red band x8 BIL HEP update + education  Manual Therapy: Seated; STM BIL thoracic paraspinals, rhomboids, lower trap  Neuromuscular re-ed: Slow karaokes 4 laps along counter cues for sequencing, mechanics Bosu step ups 2x5 BIL w/ UE support cues for posture and ankle positioning/stability    [facility] Adult PT Treatment:                                                 DATE: 01/16/24 Therapeutic Exercise: BW squat 2x10 2 bouts Superset  step tap  BIL x8 Fwd/retro walking along counter 4 laps cues for posture w/ retro and velocity w/ fwd 2 bouts superset  Quick march w/ stance hold x5 BIL Seated green band hamstring curl x10 BIL    [facility] Adult PT Treatment:                                                DATE: 01/14/24 Therapeutic Exercise: Squat tap to chair + foam pad 2x10 cues for pacing 8 inch lateral step up x8 BIL cues for pacing Green band hamstring curl, seated 2x12 BIL  Neuromuscular re-ed: 8inch step tap 2x10 BIL no UE support cues for appropriate weight shift, single limb stability, and stepping response as needed SBA/CGA Lateral 8 inch step tap 2x10 BIL LE, no UE support, cues for single limb stability, CGA weaning to supervision Tandem walk, no UE support (CGA weaning to close supervision) 4x48ft    PATIENT EDUCATION:  Education details: rationale for interventions, HEP  Person educated: Patient Education method: Explanation, Demonstration, Tactile cues, Verbal cues Education comprehension: verbalized understanding, returned demonstration, verbal cues required, tactile cues required, and needs further education     HOME EXERCISE PROGRAM: Access Code: [email or web address] URL: [email or web address] Date: 01/28/2024 Prepared by: Alm Jenny  Exercises - Step Taps on High Step  - 1 x daily - 3-4 x weekly - 1 sets - 10 reps - Squat  - 3-4 x weekly - 2-3 sets - 8 reps - Forward Monster Walk with Resistance (BKA)  - 1 x daily - 3-4 x weekly - 2 sets - 10 reps - Side Stepping with Resistance at Thighs  - 1 x daily - 3-4 x weekly - 2 sets - 10 reps - Standing Hip Extension with Counter Support  - 1 x daily - 3-4 x weekly - 2-3 sets - 10 reps - Standing Hip Abduction with Counter Support  - 1 x daily - 3-4 x weekly - 2-3 sets - 10 reps - Walking Tandem Stance  - 3-4 x weekly - 1 sets - 10 reps - Carioca with Counter Support  - 3-4 x  weekly - 2-3 sets - 5-8 reps  ASSESSMENT:  CLINICAL IMPRESSION: 01/28/2024 Pt arrives w/o pain, no  issues after last session. Today continuing with super set format which pt does well with, walking interspersed between exercises to work on mechanics and pacing. No adverse events, cues as above. Tolerates session well with fatigue as expected. Recommend continuing along current POC in order to address relevant deficits and improve functional tolerance. Pt departs today's session in no acute distress, all voiced questions/concerns addressed appropriately from PT perspective.     Eval: Patient is a 43 y.o. male who was seen today for physical therapy evaluation and treatment for thoracic spine pain and LE weakness s/p T4-6 laminectomy due to epidural mass. He also reports balance deficits and has been using a cane since October. All tests have been negative for cancer. He has a bone marrow test scheduled for early January. He demonstrates B LE weakness, marked flexibility deficits and higher level balance deficits. His BERG score of 50/56 puts him at moderate risk for falls. No gait belt required today and no incidences of LOB with gait. He will benefit from skilled PT to address these deficits.    OBJECTIVE IMPAIRMENTS: decreased activity tolerance, decreased balance, difficulty walking, decreased ROM, decreased strength, increased muscle spasms, impaired flexibility, postural dysfunction, and pain.   ACTIVITY LIMITATIONS: carrying, lifting, bending, standing, stairs, and locomotion level  PARTICIPATION LIMITATIONS: cleaning, shopping, community activity, occupation, and yard work  PERSONAL FACTORS: Fitness, Profession, and 1 comorbidity: myelopathy  are also affecting patient's functional outcome.   REHAB POTENTIAL: Good  CLINICAL DECISION MAKING: Evolving/moderate complexity  EVALUATION COMPLEXITY: Moderate   GOALS: Goals reviewed with patient? Yes  SHORT TERM GOALS: Target date: 01/13/2024    Patient will be independent with initial HEP. Baseline:  01/14/24: reports good HEP performance Goal status: MET   2. DGI completed to assess fall risk. Baseline:  Goal status: MET   LONG TERM GOALS: Target date: 02/17/2024  Patient will be independent with advanced/ongoing HEP to improve outcomes and carryover.  Baseline:  Goal status: INITIAL  2.  Patient will be able to ambulate 600' in 6 minutes without AD with good safety to access community.  Baseline:  Goal status: INITIAL  3.  Patient will be able to step up/down curb safely without AD with good safety.  Baseline:  Goal status: INITIAL   4.  Patient will demonstrate decreased 5XSTS by 2-3 seconds showing improved functional strength. Baseline: 11.53 sec Goal status: INITIAL  5.  Patient will demonstrate at least 19/24 on DGI to improve gait stability and reduce risk for falls. Baseline: TBD Goal status: MET  6.  Patient will score 56 on Berg Balance test to demonstrate lower risk of falls. (MCID= 8 points) .  Baseline: 50 Goal status: INITIAL  7.  Patient will report 6 on Modified Oswestry to demonstrate improved functional ability. Baseline: 12 / 50 = 24.0 % Goal status: INITIAL  8.  Patient will report decreased back pain by 75% to improved QOL. Baseline:  Goal status: INITIAL    PLAN:  PT FREQUENCY: 2x/week  PT DURATION: 8 weeks  PLANNED INTERVENTIONS: 97164- PT Re-evaluation, 97110-Therapeutic exercises, 97530- Therapeutic activity, 97112- Neuromuscular re-education, 97535- Self Care, [id number]- Manual therapy, [phone]- Gait training, [phone]- Aquatic Therapy, [phone]- Electrical stimulation (unattended), Patient/Family education, Balance training, Stair training, Taping, Dry Needling, Joint mobilization, Spinal mobilization, Scar mobilization, Cryotherapy, and Moist heat.  PLAN FOR NEXT SESSION: continue working on multiplanar postural stability. LE resisted exercises w/ emphasis on muscular  strength/endurance.    Alm DELENA Jenny PT, DPT 01/28/2024 12:54 PM

## 2024-01-28 ENCOUNTER — Encounter: Payer: Self-pay | Admitting: Physical Therapy

## 2024-01-28 ENCOUNTER — Ambulatory Visit: Payer: 59 | Attending: Neurosurgery | Admitting: Physical Therapy

## 2024-01-28 DIAGNOSIS — M6281 Muscle weakness (generalized): Secondary | ICD-10-CM | POA: Diagnosis present

## 2024-01-28 DIAGNOSIS — R2681 Unsteadiness on feet: Secondary | ICD-10-CM | POA: Insufficient documentation

## 2024-01-28 DIAGNOSIS — M546 Pain in thoracic spine: Secondary | ICD-10-CM | POA: Diagnosis present

## 2024-01-30 ENCOUNTER — Ambulatory Visit: Payer: 59 | Admitting: Physical Therapy

## 2024-01-30 ENCOUNTER — Encounter: Payer: Self-pay | Admitting: Physical Therapy

## 2024-01-30 DIAGNOSIS — M6281 Muscle weakness (generalized): Secondary | ICD-10-CM | POA: Diagnosis not present

## 2024-01-30 DIAGNOSIS — R2681 Unsteadiness on feet: Secondary | ICD-10-CM

## 2024-01-30 DIAGNOSIS — M546 Pain in thoracic spine: Secondary | ICD-10-CM

## 2024-01-30 NOTE — Therapy (Signed)
 OUTPATIENT PHYSICAL THERAPY THORACOLUMBAR TREATMENT   Patient Name: Charles Barrera MRN: [id number] DOB:1981-05-16, 43 y.o., male Today's Date: 01/30/2024  END OF SESSION:  PT End of Session - 01/30/24 1019     Visit Number 10    Date for PT Re-Evaluation 02/17/24    Authorization Type [employer]    PT Start Time 1019    PT Stop Time 1100    PT Time Calculation (min) 41 min    Activity Tolerance Patient tolerated treatment well                History reviewed. No pertinent past medical history. Past Surgical History:  Procedure Laterality Date   FLEXIBLE SIGMOIDOSCOPY N/A 10/27/2023   Procedure: FLEXIBLE SIGMOIDOSCOPY;  Surgeon: Rollin Dover, MD;  Location: [facility] ENDOSCOPY;  Service: Gastroenterology;  Laterality: N/A;   LAMINECTOMY N/A 10/25/2023   Procedure: Thoracic four-thoracic six laminectomy for resection of epidural mass;  Surgeon: Debby Dorn MATSU, MD;  Location: [facility] OR;  Service: Neurosurgery;  Laterality: N/A;   Patient Active Problem List   Diagnosis Date Noted   Abnormal CT scan, colon 11/08/2023   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   Lytic bone lesions on xray 10/23/2023    PCP: Claudene Round, MD   REFERRING PROVIDER: Debby Dorn MATSU, MD   REFERRING DIAG: [phone] (ICD-10-CM) - Spondylosis with myelopathy, thoracic region  Rationale for Evaluation and Treatment: Rehabilitation  THERAPY DIAG:  Muscle weakness (generalized)  Unsteadiness on feet  Pain in thoracic spine  ONSET DATE: 10/25/23 DOS T4-6 laminectomy for resection of epidural mass  SUBJECTIVE:                                                                                                                                                                                           SUBJECTIVE STATEMENT: 01/30/2024 no new updates, feeling about the same overall. Still some fatigue after PT sessions.    PERTINENT HISTORY:  unremarkable  PAIN:  Are you having pain? Yes: NPRS scale: none  today   Pain location: mid and low back Pain description: pressure Aggravating factors: prolonged standing Relieving factors: siting or changing positions  PRECAUTIONS: none  RED FLAGS: None   WEIGHT BEARING RESTRICTIONS: No  FALLS:  Has patient fallen in last 6 months? 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  DIAGNOSTIC FINDINGS:  12/05/23 MRI IMPRESSION: 1. 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 2. T2 hyperintensity in the cord at the level of prior surgery, likely post compressive. No residual cord mass effect. 3. Unchanged widespread marrow infiltration, correlate with pathology.  PATIENT SURVEYS:  Modified Oswestry 12 / 50 = 24.0 %   COGNITION: Overall cognitive status: Within functional limits for tasks assessed     SENSATION: Not full sensation back but better than prior to surgery  MUSCLE LENGTH: Quads L> R, marked HS, piriformis and gastroc B   POSTURE: rounded shoulders, forward head, and flexed trunk   PALPATION: Tender and tight in bil thoracic paraspinals around incision  LUMBAR ROM: mild tightness with rotation B  AROM eval  Flexion   Extension   Right lateral flexion   Left lateral flexion   Right rotation   Left rotation    (Blank rows = not tested)  LOWER EXTREMITY MMT:     MMT  Right eval Left eval  Hip flexion 4+ 4  Hip extension 5 4+  Hip abduction 4 4-  Hip adduction 5 5  Hip internal rotation    Hip external rotation    Knee flexion 4 5   Knee extension 5 5  Ankle dorsiflexion 5 5  Ankle plantarflexion    Ankle inversion    Ankle eversion     (Blank rows = not tested)  LOWER EXTREMITY ROM:  WFL, but limited by flexibility deficits   FUNCTIONAL TESTS:  5 times sit to stand: 11.53 sec Berg Balance Scale: 50/56 Dynamic Gait Index: 22/24 on 12/26/23  GAIT: Distance walked: 20 Assistive device utilized: Single point cane Level of assistance: Modified independence Comments: slow gait, wide BOS  TREATMENT DATE:                                                                                                                               [facility] Adult PT Treatment:                                                DATE: 01/30/24  Neuromuscular re-ed: Obstacle course (staircase, airex step over, 5 cone weaving) for postural stability. 43sec;33sec; 32sec; 31sec; 29sec. 1 min rest between all sets Bosu lunge x10 RLE  Slow karaokes 3 laps (~20 feet each way) Lateral step up/down small step x12 BIL Therapeutic Activity: 2-3 laps walking between exercises 5# squat x8, 10# 2x8    [facility] Adult PT Treatment:  DATE: 01/28/24 Therapeutic Exercise: Superset 2 bouts; BW squat x8 (quick up, slow down) w/ heel raises x 8 (quick up, slow down) Red band hip abduction/diagonal/extension; standing, 3x5 each way HEP update + education  Neuromuscular re-ed: Superset 2 bouts bosu mini lunge x5 BIL, cone tap x8 BIL for improved postural stability and muscular fatigue 2 inch step hip drop/hike 2x8 BIL w/ UE support for lateral hip activation, pelvic control, single limb stability  Therapeutic Activity: Walking 2 laps between each superset to improve activity tolerance, cues for pacing and mechanics   [facility] Adult PT Treatment:                                                DATE: 01/22/24 Therapeutic Exercise: Superset BW squat x8, quick marches x8 ; 3 bouts BIL cues for pacing, 1-51min rest breaks  between bouts 6 inch step up x8 BIL 45 deg hip kickbacks red band x8 BIL HEP update + education  Manual Therapy: Seated; STM BIL thoracic paraspinals, rhomboids, lower trap  Neuromuscular re-ed: Slow karaokes 4 laps along counter cues for sequencing, mechanics Bosu step ups 2x5 BIL w/ UE support cues for posture and ankle positioning/stability    [facility] Adult PT Treatment:                                                DATE: 01/16/24 Therapeutic Exercise: BW squat 2x10 2 bouts Superset  step tap  BIL x8 Fwd/retro walking along counter 4 laps cues for posture w/ retro and velocity w/ fwd 2 bouts superset  Quick march w/ stance hold x5 BIL Seated green band hamstring curl x10 BIL    [facility] Adult PT Treatment:                                                DATE: 01/14/24 Therapeutic Exercise: Squat tap to chair + foam pad 2x10 cues for pacing 8 inch lateral step up x8 BIL cues for pacing Green band hamstring curl, seated 2x12 BIL  Neuromuscular re-ed: 8inch step tap 2x10 BIL no UE support cues for appropriate weight shift, single limb stability, and stepping response as needed SBA/CGA Lateral 8 inch step tap 2x10 BIL LE, no UE support, cues for single limb stability, CGA weaning to supervision Tandem walk, no UE support (CGA weaning to close supervision) 4x80ft    PATIENT EDUCATION:  Education details: rationale for interventions, HEP  Person educated: Patient Education method: Explanation, Demonstration, Tactile cues, Verbal cues Education comprehension: verbalized understanding, returned demonstration, verbal cues required, tactile cues required, and needs further education     HOME EXERCISE PROGRAM: Access Code: [email or web address] URL: [email or web address] Date: 01/28/2024 Prepared by: Alm Jenny  Exercises - Step Taps on High Step  - 1 x daily - 3-4 x weekly - 1 sets - 10 reps - Squat  - 3-4 x weekly - 2-3 sets - 8 reps - Forward Monster Walk with Resistance  (BKA)  - 1 x daily - 3-4 x weekly - 2 sets - 10 reps - Side Stepping with Resistance at Thighs  - 1 x  daily - 3-4 x weekly - 2 sets - 10 reps - Standing Hip Extension with Counter Support  - 1 x daily - 3-4 x weekly - 2-3 sets - 10 reps - Standing Hip Abduction with Counter Support  - 1 x daily - 3-4 x weekly - 2-3 sets - 10 reps - Walking Tandem Stance  - 3-4 x weekly - 1 sets - 10 reps - Carioca with Counter Support  - 3-4 x weekly - 2-3 sets - 5-8 reps  ASSESSMENT:  CLINICAL IMPRESSION: 01/30/2024 Pt arrives w/o pain, no new updates. Today continuing to work on dynamic postural stability, obstacle navigation, and LE strengthening. Able to progress resistance w/ squats. Tolerates well without adverse events, no increase in pain. Recommend continuing along current POC in order to address relevant deficits and improve functional tolerance. Pt departs today's session in no acute distress, all voiced questions/concerns addressed appropriately from PT perspective.    Eval: Patient is a 43 y.o. male who was seen today for physical therapy evaluation and treatment for thoracic spine pain and LE weakness s/p T4-6 laminectomy due to epidural mass. He also reports balance deficits and has been using a cane since October. All tests have been negative for cancer. He has a bone marrow test scheduled for early January. He demonstrates B LE weakness, marked flexibility deficits and higher level balance deficits. His BERG score of 50/56 puts him at moderate risk for falls. No gait belt required today and no incidences of LOB with gait. He will benefit from skilled PT to address these deficits.    OBJECTIVE IMPAIRMENTS: decreased activity tolerance, decreased balance, difficulty walking, decreased ROM, decreased strength, increased muscle spasms, impaired flexibility, postural dysfunction, and pain.   ACTIVITY LIMITATIONS: carrying, lifting, bending, standing, stairs, and locomotion level  PARTICIPATION  LIMITATIONS: cleaning, shopping, community activity, occupation, and yard work  PERSONAL FACTORS: Fitness, Profession, and 1 comorbidity: myelopathy  are also affecting patient's functional outcome.   REHAB POTENTIAL: Good  CLINICAL DECISION MAKING: Evolving/moderate complexity  EVALUATION COMPLEXITY: Moderate   GOALS: Goals reviewed with patient? Yes  SHORT TERM GOALS: Target date: 01/13/2024   Patient will be independent with initial HEP. Baseline:  01/14/24: reports good HEP performance Goal status: MET   2. DGI completed to assess fall risk. Baseline:  Goal status: MET   LONG TERM GOALS: Target date: 02/17/2024  Patient will be independent with advanced/ongoing HEP to improve outcomes and carryover.  Baseline:  Goal status: INITIAL  2.  Patient will be able to ambulate 600' in 6 minutes without AD with good safety to access community.  Baseline:  Goal status: INITIAL  3.  Patient will be able to step up/down curb safely without AD with good safety.  Baseline:  Goal status: INITIAL   4.  Patient will demonstrate decreased 5XSTS by 2-3 seconds showing improved functional strength. Baseline: 11.53 sec Goal status: INITIAL  5.  Patient will demonstrate at least 19/24 on DGI to improve gait stability and reduce risk for falls. Baseline: TBD Goal status: MET  6.  Patient will score 56 on Berg Balance test to demonstrate lower risk of falls. (MCID= 8 points) .  Baseline: 50 Goal status: INITIAL  7.  Patient will report 6 on Modified Oswestry to demonstrate improved functional ability. Baseline: 12 / 50 = 24.0 % Goal status: INITIAL  8.  Patient will report decreased back pain by 75% to improved QOL. Baseline:  Goal status: INITIAL    PLAN:  PT FREQUENCY: 2x/week  PT DURATION: 8 weeks  PLANNED INTERVENTIONS: 97164- PT Re-evaluation, 97110-Therapeutic exercises, 97530- Therapeutic activity, 97112- Neuromuscular re-education, 97535- Self Care, [id number]- Manual  therapy, [phone]- Gait training, [phone]- Aquatic Therapy, [phone]- Electrical stimulation (unattended), Patient/Family education, Balance training, Stair training, Taping, Dry Needling, Joint mobilization, Spinal mobilization, Scar mobilization, Cryotherapy, and Moist heat.  PLAN FOR NEXT SESSION: continue working on multiplanar postural stability. LE resisted exercises w/ emphasis on muscular strength/endurance.    Alm DELENA Jenny PT, DPT 01/30/2024 12:37 PM

## 2024-02-03 ENCOUNTER — Encounter: Payer: Self-pay | Admitting: Physical Therapy

## 2024-02-03 ENCOUNTER — Ambulatory Visit: Payer: 59 | Admitting: Physical Therapy

## 2024-02-03 DIAGNOSIS — M546 Pain in thoracic spine: Secondary | ICD-10-CM

## 2024-02-03 DIAGNOSIS — M6281 Muscle weakness (generalized): Secondary | ICD-10-CM | POA: Diagnosis not present

## 2024-02-03 DIAGNOSIS — R2681 Unsteadiness on feet: Secondary | ICD-10-CM

## 2024-02-03 NOTE — Therapy (Signed)
 OUTPATIENT PHYSICAL THERAPY THORACOLUMBAR TREATMENT   Patient Name: Charles Barrera MRN: [id number] DOB:[birth year], 43 y.o., male Today's Date: 02/03/2024  END OF SESSION:  PT End of Session - 02/03/24 1318     Visit Number 11    Date for PT Re-Evaluation 02/17/24    Authorization Type [employer]    PT Start Time 1318    PT Stop Time 1400    PT Time Calculation (min) 42 min    Activity Tolerance Patient tolerated treatment well                History reviewed. No pertinent past medical history. Past Surgical History:  Procedure Laterality Date   FLEXIBLE SIGMOIDOSCOPY N/A 10/27/2023   Procedure: FLEXIBLE SIGMOIDOSCOPY;  Surgeon: Alvis Jourdain, MD;  Location: [facility];  Service: Gastroenterology;  Laterality: N/A;   LAMINECTOMY N/A 10/25/2023   Procedure: Thoracic four-thoracic six laminectomy for resection of epidural mass;  Surgeon: Van Gelinas, MD;  Location: [facility] OR;  Service: Neurosurgery;  Laterality: N/A;   Patient Active Problem List   Diagnosis Date Noted   Abnormal CT scan, colon 11/08/2023   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   Lytic bone lesions on xray 10/23/2023    PCP: Bertrum Brodie, MD   REFERRING PROVIDER: Bertrum Brodie, MD   REFERRING DIAG: [id number] (ICD-10-CM) - Spondylosis with myelopathy, thoracic region  Rationale for Evaluation and Treatment: Rehabilitation  THERAPY DIAG:  Muscle weakness (generalized)  Unsteadiness on feet  Pain in thoracic spine  ONSET DATE: 10/25/23 DOS T4-6 laminectomy for resection of epidural mass  SUBJECTIVE:                                                                                                                                                                                           SUBJECTIVE STATEMENT: 02/03/2024 Pt arrives w/o pain, no issues after last session and no new updates.    PERTINENT HISTORY:  unremarkable  PAIN:  Are you having pain? Yes: NPRS scale: none today   Pain  location: mid and low back Pain description: pressure Aggravating factors: prolonged standing Relieving factors: siting or changing positions  PRECAUTIONS: none  RED FLAGS: None   WEIGHT BEARING RESTRICTIONS: No  FALLS:  Has patient fallen in last 6 months? 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  DIAGNOSTIC FINDINGS:  12/05/23 MRI IMPRESSION: 1. 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 2. T2 hyperintensity in the cord at the level of prior surgery, likely post compressive. No residual cord mass effect. 3. Unchanged widespread marrow infiltration, correlate with pathology.  PATIENT SURVEYS:  Modified Oswestry 12 / 50 = 24.0 %   COGNITION: Overall cognitive status: Within functional limits for tasks assessed     SENSATION: Not full sensation back but better than prior to surgery  MUSCLE LENGTH: Quads L> R, marked HS, piriformis and gastroc B   POSTURE: rounded shoulders, forward head, and flexed trunk   PALPATION: Tender and tight in bil thoracic paraspinals around incision  LUMBAR ROM: mild tightness with rotation B  AROM eval  Flexion   Extension   Right lateral flexion   Left lateral flexion   Right rotation   Left rotation    (Blank rows = not tested)  LOWER EXTREMITY MMT:     MMT  Right eval Left eval  Hip flexion 4+ 4  Hip extension 5 4+  Hip abduction 4 4-  Hip adduction 5 5  Hip internal rotation    Hip external rotation    Knee flexion 4 5  Knee  extension 5 5  Ankle dorsiflexion 5 5  Ankle plantarflexion    Ankle inversion    Ankle eversion     (Blank rows = not tested)  LOWER EXTREMITY ROM:  WFL, but limited by flexibility deficits   FUNCTIONAL TESTS:  5 times sit to stand: 11.53 sec Berg Balance Scale: 50/56 Dynamic Gait Index: 22/24 on 12/26/23  GAIT: Distance walked: 20 Assistive device utilized: Single point cane Level of assistance: Modified independence Comments: slow gait, wide BOS  TREATMENT DATE:                                                                                                                               [facility] Adult PT Treatment:                                                DATE: 02/03/24 Therapeutic Exercise: Superset; lateral step down and hip hike x8 BIL on 4 inch step; 2 bouts Lateral lunges x8 BIL HEP update + education  Neuro re-education Tandem weight shifts + rocking x20 each Obstacle course (staircase, airex step over, 5 cone weaving) for postural stability. (35sec, 32sec, 29sec, 27sec, 24sec)  Therapeutic Activity: 2-3 laps walking between exercises w/ emphasis on weight shifting/mechanics    [facility] Adult PT Treatment:  DATE: 01/30/24  Neuromuscular re-ed: Obstacle course (staircase, airex step over, 5 cone weaving) for postural stability. 42sec;33sec; 32sec; 31sec; 29sec. 1 min rest between all sets Bosu lunge x10 RLE  Slow karaokes 3 laps (~20 feet each way) Lateral step up/down small step x12 BIL Therapeutic Activity: 2-3 laps walking between exercises 5# squat x8, 10# 2x8    [facility] Adult PT Treatment:                                                DATE: 01/28/24 Therapeutic Exercise: Superset 2 bouts; BW squat x8 (quick up, slow down) w/ heel raises x 8 (quick up, slow down) Red band hip abduction/diagonal/extension; standing, 3x5 each way HEP update + education  Neuromuscular re-ed: Superset 2 bouts bosu mini lunge x5 BIL,  cone tap x8 BIL for improved postural stability and muscular fatigue 2 inch step hip drop/hike 2x8 BIL w/ UE support for lateral hip activation, pelvic control, single limb stability  Therapeutic Activity: Walking 2 laps between each superset to improve activity tolerance, cues for pacing and mechanics   [facility] Adult PT Treatment:                                                DATE: 01/22/24 Therapeutic Exercise: Superset BW squat x8, quick marches x8 ; 3 bouts BIL cues for pacing, 1-57min rest breaks between bouts 6 inch step up x8 BIL 45 deg hip kickbacks red band x8 BIL HEP update + education  Manual Therapy: Seated; STM BIL thoracic paraspinals, rhomboids, lower trap  Neuromuscular re-ed: Slow karaokes 4 laps along counter cues for sequencing, mechanics Bosu step ups 2x5 BIL w/ UE support cues for posture and ankle positioning/stability    [facility] Adult PT Treatment:                                                DATE: 01/16/24 Therapeutic Exercise: BW squat 2x10 2 bouts Superset  step tap  BIL x8 Fwd/retro walking along counter 4 laps cues for posture w/ retro and velocity w/ fwd 2 bouts superset  Quick march w/ stance hold x5 BIL Seated green band hamstring curl x10 BIL    [facility] Adult PT Treatment:                                                DATE: 01/14/24 Therapeutic Exercise: Squat tap to chair + foam pad 2x10 cues for pacing 8 inch lateral step up x8 BIL cues for pacing Green band hamstring curl, seated 2x12 BIL  Neuromuscular re-ed: 8inch step tap 2x10 BIL no UE support cues for appropriate weight shift, single limb stability, and stepping response as needed SBA/CGA Lateral 8 inch step tap 2x10 BIL LE, no UE support, cues for single limb stability, CGA weaning to supervision Tandem walk, no UE support (CGA weaning to close supervision) 4x62ft    PATIENT EDUCATION:  Education details: rationale for interventions, HEP  Person educated:  Patient Education method:  Explanation, Demonstration, Tactile cues, Verbal cues Education comprehension: verbalized understanding, returned demonstration, verbal cues required, tactile cues required, and needs further education     HOME EXERCISE PROGRAM: Access Code: [email or web address] URL: [email or web address] Date: 02/03/2024 Prepared by: Mayme Spearman  Exercises - Step Taps on High Step  - 1 x daily - 3-4 x weekly - 1 sets - 10 reps - Squat  - 3-4 x weekly - 2-3 sets - 8 reps - Forward Monster Walk with Resistance (BKA)  - 1 x daily - 3-4 x weekly - 2 sets - 10 reps - Side Stepping with Resistance at Thighs  - 1 x daily - 3-4 x weekly - 2 sets - 10 reps - Standing Hip Extension with Counter Support  - 1 x daily - 3-4 x weekly - 2-3 sets - 10 reps - Standing Hip Abduction with Counter Support  - 1 x daily - 3-4 x weekly - 2-3 sets - 10 reps - Walking Tandem Stance  - 3-4 x weekly - 1 sets - 10 reps - Carioca with Counter Support  - 3-4 x weekly - 2-3 sets - 5-8 reps - Stride Stance Weight Shift  - 2-3 x daily - 1 sets - 15 reps  ASSESSMENT:  CLINICAL IMPRESSION: 02/03/2024 Pt arrives w/o pain, no new updates. Demonstrates improving postural stability with increased velocity for obstacle course. Emphasis on weight shifting mechanics w/ LLE during ambulation between exercises, able to correct well and does well with addition of tandem weight shifts which is added to HEP. No adverse events, reports muscular fatigue in keeping with prior sessions. Recommend continuing along current POC in order to address relevant deficits and improve functional tolerance. Pt departs today's session in no acute distress, all voiced questions/concerns addressed appropriately from PT perspective.     Eval: Patient is a 43 y.o. male who was seen today for physical therapy evaluation and treatment for thoracic spine pain and LE weakness s/p T4-6 laminectomy due to epidural mass. He also reports balance deficits and has been using a  cane since October. All tests have been negative for cancer. He has a bone marrow test scheduled for early January. He demonstrates B LE weakness, marked flexibility deficits and higher level balance deficits. His BERG score of 50/56 puts him at moderate risk for falls. No gait belt required today and no incidences of LOB with gait. He will benefit from skilled PT to address these deficits.    OBJECTIVE IMPAIRMENTS: decreased activity tolerance, decreased balance, difficulty walking, decreased ROM, decreased strength, increased muscle spasms, impaired flexibility, postural dysfunction, and pain.   ACTIVITY LIMITATIONS: carrying, lifting, bending, standing, stairs, and locomotion level  PARTICIPATION LIMITATIONS: cleaning, shopping, community activity, occupation, and yard work  PERSONAL FACTORS: Fitness, Profession, and 1 comorbidity: myelopathy  are also affecting patient's functional outcome.   REHAB POTENTIAL: Good  CLINICAL DECISION MAKING: Evolving/moderate complexity  EVALUATION COMPLEXITY: Moderate   GOALS: Goals reviewed with patient? Yes  SHORT TERM GOALS: Target date: 01/13/2024   Patient will be independent with initial HEP. Baseline:  01/14/24: reports good HEP performance Goal status: MET   2. DGI completed to assess fall risk. Baseline:  Goal status: MET   LONG TERM GOALS: Target date: 02/17/2024  Patient will be independent with advanced/ongoing HEP to improve outcomes and carryover.  Baseline:  Goal status: INITIAL  2.  Patient will be able to ambulate 600' in 6 minutes without AD with good safety to access community.  Baseline:  Goal status: INITIAL  3.  Patient will be able to step up/down curb safely without AD with good safety.  Baseline:  Goal status: INITIAL   4.  Patient will demonstrate decreased 5XSTS by 2-3 seconds showing improved functional strength. Baseline: 11.53 sec Goal status: INITIAL  5.  Patient will demonstrate at least 19/24 on DGI  to improve gait stability and reduce risk for falls. Baseline: TBD Goal status: MET  6.  Patient will score 56 on Berg Balance test to demonstrate lower risk of falls. (MCID= 8 points) .  Baseline: 50 Goal status: INITIAL  7.  Patient will report 6 on Modified Oswestry to demonstrate improved functional ability. Baseline: 12 / 50 = 24.0 % Goal status: INITIAL  8.  Patient will report decreased back pain by 75% to improved QOL. Baseline:  Goal status: INITIAL    PLAN:  PT FREQUENCY: 2x/week  PT DURATION: 8 weeks  PLANNED INTERVENTIONS: 97164- PT Re-evaluation, 97110-Therapeutic exercises, 97530- Therapeutic activity, 97112- Neuromuscular re-education, 97535- Self Care, 16109- Manual therapy, [phone]- Gait training, [phone]- Aquatic Therapy, [phone]- Electrical stimulation (unattended), Patient/Family education, Balance training, Stair training, Taping, Dry Needling, Joint mobilization, Spinal mobilization, Scar mobilization, Cryotherapy, and Moist heat.  PLAN FOR NEXT SESSION: continue working on multiplanar postural stability. LE resisted exercises w/ emphasis on muscular strength/endurance.    Lovett Ruck PT, DPT 02/03/2024 2:03 PM

## 2024-02-04 NOTE — Therapy (Signed)
OUTPATIENT PHYSICAL THERAPY THORACOLUMBAR TREATMENT   Patient Name: Charles Barrera MRN: [id number] DOB:[birth year], 43 y.o., male Today's Date: 02/05/2024  END OF SESSION:  PT End of Session - 02/05/24 1110     Visit Number 12    Date for PT Re-Evaluation 02/17/24    Authorization Type BC[employer]    PT Start Time 1110    PT Stop Time 1151    PT Time Calculation (min) 41 min    Activity Tolerance Patient tolerated treatment well                 History reviewed. No pertinent past medical history. Past Surgical History:  Procedure Laterality Date   FLEXIBLE SIGMOIDOSCOPY N/A 10/27/2023   Procedure: FLEXIBLE SIGMOIDOSCOPY;  Surgeon: Jeani Hawking, MD;  Location: [facility] ENDOSCOPY;  Service: Gastroenterology;  Laterality: N/A;   LAMINECTOMY N/A 10/25/2023   Procedure: Thoracic four-thoracic six laminectomy for resection of epidural mass;  Surgeon: Bedelia Person, MD;  Location: [facility] OR;  Service: Neurosurgery;  Laterality: N/A;   Patient Active Problem List   Diagnosis Date Noted   Abnormal CT scan, colon 11/08/2023   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   Lytic bone lesions on xray 10/23/2023    PCP: Caffie Damme, MD   REFERRING PROVIDER: Bedelia Person, MD   REFERRING DIAG: [phone] (ICD-10-CM) - Spondylosis with myelopathy, thoracic region  Rationale for Evaluation and Treatment: Rehabilitation  THERAPY DIAG:  Muscle weakness (generalized)  Unsteadiness on feet  Pain in thoracic spine  ONSET DATE: 10/25/23 DOS T4-6 laminectomy for resection of epidural mass  SUBJECTIVE:                                                                                                                                                                                           SUBJECTIVE STATEMENT: 02/05/2024 fatigue after last session back to baseline by next day. Some mild neck pain the last couple days, attributed to sleeping position.     PERTINENT HISTORY:   unremarkable  PAIN:  Are you having pain? Yes: NPRS scale: 4/10 neck pain/stiffness Pain location: mid and low back Pain description: pressure Aggravating factors: prolonged standing Relieving factors: siting or changing positions  PRECAUTIONS: none  RED FLAGS: None   WEIGHT BEARING RESTRICTIONS: No  FALLS:  Has patient fallen in last 6 months? 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  DIAGNOSTIC FINDINGS:  12/05/23 MRI IMPRESSION: 1. 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 2. T2 hyperintensity in the cord at the level of prior surgery, likely post compressive. No residual cord mass effect. 3. Unchanged widespread marrow infiltration, correlate with pathology.  PATIENT SURVEYS:  Modified Oswestry 12 / 50 = 24.0 %   COGNITION: Overall cognitive status: Within functional limits for tasks assessed     SENSATION: Not full sensation back but better than prior to surgery  MUSCLE LENGTH: Quads L> R, marked HS, piriformis and gastroc B   POSTURE: rounded shoulders, forward head, and flexed trunk   PALPATION: Tender and tight in bil thoracic paraspinals around incision  LUMBAR ROM: mild tightness with rotation B  AROM eval  Flexion   Extension   Right lateral flexion   Left lateral flexion   Right rotation   Left rotation    (Blank rows = not tested)  LOWER EXTREMITY MMT:     MMT  Right eval Left eval  Hip flexion 4+ 4  Hip extension 5 4+  Hip abduction 4 4-  Hip  adduction 5 5  Hip internal rotation    Hip external rotation    Knee flexion 4 5  Knee extension 5 5  Ankle dorsiflexion 5 5  Ankle plantarflexion    Ankle inversion    Ankle eversion     (Blank rows = not tested)  LOWER EXTREMITY ROM:  WFL, but limited by flexibility deficits   FUNCTIONAL TESTS:  5 times sit to stand: 11.53 sec Berg Balance Scale: 50/56 Dynamic Gait Index: 22/24 on 12/26/23  GAIT: Distance walked: 20 Assistive device utilized: Single point cane Level of assistance: Modified independence Comments: slow gait, wide BOS  TREATMENT DATE:                                                                                                                               [facility] Adult PT Treatment:                                                DATE: 02/05/24  Neuromuscular re-ed: Obstacle course (staircase, airex step over, 5 cone weaving) for postural stability. (26sec, 25 sec, 23sec, 22sec, 20sec) Lateral lunges x10 BIL cues for hip activation  4 inch runner step up x10 BIL  cues for single limb stability, inc hip activation on stance limb  Therapeutic Activity: 3 laps walking between exercises cues for weightshifting mechanics 5# suitcase carry 3 laps (13ft) BIL cues for posture Squat w/ 10# 2x8   [facility] Adult PT Treatment:  DATE: 02/03/24 Therapeutic Exercise: Superset; lateral step down and hip hike x8 BIL on 4 inch step; 2 bouts Lateral lunges x8 BIL HEP update + education  Neuro re-education Tandem weight shifts + rocking x20 each Obstacle course (staircase, airex step over, 5 cone weaving) for postural stability. (35sec, 32sec, 29sec, 27sec, 24sec)  Therapeutic Activity: 2-3 laps walking between exercises w/ emphasis on weight shifting/mechanics    [facility] Adult PT Treatment:                                                DATE: 01/30/24  Neuromuscular re-ed: Obstacle course (staircase, airex step over, 5 cone  weaving) for postural stability. 43sec;33sec; 32sec; 31sec; 29sec. 1 min rest between all sets Bosu lunge x10 RLE  Slow karaokes 3 laps (~20 feet each way) Lateral step up/down small step x12 BIL Therapeutic Activity: 2-3 laps walking between exercises 5# squat x8, 10# 2x8    [facility] Adult PT Treatment:                                                DATE: 01/28/24 Therapeutic Exercise: Superset 2 bouts; BW squat x8 (quick up, slow down) w/ heel raises x 8 (quick up, slow down) Red band hip abduction/diagonal/extension; standing, 3x5 each way HEP update + education  Neuromuscular re-ed: Superset 2 bouts bosu mini lunge x5 BIL, cone tap x8 BIL for improved postural stability and muscular fatigue 2 inch step hip drop/hike 2x8 BIL w/ UE support for lateral hip activation, pelvic control, single limb stability  Therapeutic Activity: Walking 2 laps between each superset to improve activity tolerance, cues for pacing and mechanics   [facility] Adult PT Treatment:                                                DATE: 01/22/24 Therapeutic Exercise: Superset BW squat x8, quick marches x8 ; 3 bouts BIL cues for pacing, 1-52min rest breaks between bouts 6 inch step up x8 BIL 45 deg hip kickbacks red band x8 BIL HEP update + education  Manual Therapy: Seated; STM BIL thoracic paraspinals, rhomboids, lower trap  Neuromuscular re-ed: Slow karaokes 4 laps along counter cues for sequencing, mechanics Bosu step ups 2x5 BIL w/ UE support cues for posture and ankle positioning/stability    [facility] Adult PT Treatment:                                                DATE: 01/16/24 Therapeutic Exercise: BW squat 2x10 2 bouts Superset  step tap  BIL x8 Fwd/retro walking along counter 4 laps cues for posture w/ retro and velocity w/ fwd 2 bouts superset  Quick march w/ stance hold x5 BIL Seated green band hamstring curl x10 BIL    [facility] Adult PT Treatment:  DATE:  01/14/24 Therapeutic Exercise: Squat tap to chair + foam pad 2x10 cues for pacing 8 inch lateral step up x8 BIL cues for pacing Green band hamstring curl, seated 2x12 BIL  Neuromuscular re-ed: 8inch step tap 2x10 BIL no UE support cues for appropriate weight shift, single limb stability, and stepping response as needed SBA/CGA Lateral 8 inch step tap 2x10 BIL LE, no UE support, cues for single limb stability, CGA weaning to supervision Tandem walk, no UE support (CGA weaning to close supervision) 4x57ft    PATIENT EDUCATION:  Education details: rationale for interventions, HEP  Person educated: Patient Education method: Explanation, Demonstration, Tactile cues, Verbal cues Education comprehension: verbalized understanding, returned demonstration, verbal cues required, tactile cues required, and needs further education     HOME EXERCISE PROGRAM: Access Code: [email or web address] URL: [email or web address] Date: 02/03/2024 Prepared by: Fransisco Hertz  Exercises - Step Taps on High Step  - 1 x daily - 3-4 x weekly - 1 sets - 10 reps - Squat  - 3-4 x weekly - 2-3 sets - 8 reps - Forward Monster Walk with Resistance (BKA)  - 1 x daily - 3-4 x weekly - 2 sets - 10 reps - Side Stepping with Resistance at Thighs  - 1 x daily - 3-4 x weekly - 2 sets - 10 reps - Standing Hip Extension with Counter Support  - 1 x daily - 3-4 x weekly - 2-3 sets - 10 reps - Standing Hip Abduction with Counter Support  - 1 x daily - 3-4 x weekly - 2-3 sets - 10 reps - Walking Tandem Stance  - 3-4 x weekly - 1 sets - 10 reps - Carioca with Counter Support  - 3-4 x weekly - 2-3 sets - 5-8 reps - Stride Stance Weight Shift  - 2-3 x daily - 1 sets - 15 reps  ASSESSMENT:  CLINICAL IMPRESSION: 02/05/2024 Pt arrives w/ some neck discomfort he attributes to sleeping, otherwise no new updates, continues to endorse slow/steady progress. Today continuing to work on LE functional strengthening, walking mechanics, and  postural stability. Noted improvement in time on familiar obstacle course with improved mechanics at higher velocities. No adverse events, mild fatigue at end of session. Recommend continuing along current POC in order to address relevant deficits and improve functional tolerance. Pt departs today's session in no acute distress, all voiced questions/concerns addressed appropriately from PT perspective.    Eval: Patient is a 43 y.o. male who was seen today for physical therapy evaluation and treatment for thoracic spine pain and LE weakness s/p T4-6 laminectomy due to epidural mass. He also reports balance deficits and has been using a cane since October. All tests have been negative for cancer. He has a bone marrow test scheduled for early January. He demonstrates B LE weakness, marked flexibility deficits and higher level balance deficits. His BERG score of 50/56 puts him at moderate risk for falls. No gait belt required today and no incidences of LOB with gait. He will benefit from skilled PT to address these deficits.    OBJECTIVE IMPAIRMENTS: decreased activity tolerance, decreased balance, difficulty walking, decreased ROM, decreased strength, increased muscle spasms, impaired flexibility, postural dysfunction, and pain.   ACTIVITY LIMITATIONS: carrying, lifting, bending, standing, stairs, and locomotion level  PARTICIPATION LIMITATIONS: cleaning, shopping, community activity, occupation, and yard work  PERSONAL FACTORS: Fitness, Profession, and 1 comorbidity: myelopathy  are also affecting patient's functional outcome.   REHAB POTENTIAL: Good  CLINICAL DECISION MAKING: Evolving/moderate complexity  EVALUATION COMPLEXITY:  Moderate   GOALS: Goals reviewed with patient? Yes  SHORT TERM GOALS: Target date: 01/13/2024   Patient will be independent with initial HEP. Baseline:  01/14/24: reports good HEP performance Goal status: MET   2. DGI completed to assess fall risk. Baseline:  Goal  status: MET   LONG TERM GOALS: Target date: 02/17/2024  Patient will be independent with advanced/ongoing HEP to improve outcomes and carryover.  Baseline:  Goal status: INITIAL  2.  Patient will be able to ambulate 600' in 6 minutes without AD with good safety to access community.  Baseline:  Goal status: INITIAL  3.  Patient will be able to step up/down curb safely without AD with good safety.  Baseline:  Goal status: INITIAL   4.  Patient will demonstrate decreased 5XSTS by 2-3 seconds showing improved functional strength. Baseline: 11.53 sec Goal status: INITIAL  5.  Patient will demonstrate at least 19/24 on DGI to improve gait stability and reduce risk for falls. Baseline: TBD Goal status: MET  6.  Patient will score 56 on Berg Balance test to demonstrate lower risk of falls. (MCID= 8 points) .  Baseline: 50 Goal status: INITIAL  7.  Patient will report 6 on Modified Oswestry to demonstrate improved functional ability. Baseline: 12 / 50 = 24.0 % Goal status: INITIAL  8.  Patient will report decreased back pain by 75% to improved QOL. Baseline:  Goal status: INITIAL    PLAN:  PT FREQUENCY: 2x/week  PT DURATION: 8 weeks  PLANNED INTERVENTIONS: 97164- PT Re-evaluation, 97110-Therapeutic exercises, 97530- Therapeutic activity, 97112- Neuromuscular re-education, 97535- Self Care, 16109- Manual therapy, [phone]- Gait training, [phone]- Aquatic Therapy, [phone]- Electrical stimulation (unattended), Patient/Family education, Balance training, Stair training, Taping, Dry Needling, Joint mobilization, Spinal mobilization, Scar mobilization, Cryotherapy, and Moist heat.  PLAN FOR NEXT SESSION: continue working on multiplanar postural stability. LE resisted exercises w/ emphasis on muscular strength/endurance.    Ashley Murrain PT, DPT 02/05/2024 11:54 AM

## 2024-02-05 ENCOUNTER — Encounter: Payer: Self-pay | Admitting: Physical Therapy

## 2024-02-05 ENCOUNTER — Ambulatory Visit: Payer: 59 | Admitting: Physical Therapy

## 2024-02-05 DIAGNOSIS — M6281 Muscle weakness (generalized): Secondary | ICD-10-CM

## 2024-02-05 DIAGNOSIS — R2681 Unsteadiness on feet: Secondary | ICD-10-CM

## 2024-02-05 DIAGNOSIS — M546 Pain in thoracic spine: Secondary | ICD-10-CM

## 2024-02-10 ENCOUNTER — Ambulatory Visit: Payer: 59 | Admitting: Physical Therapy

## 2024-02-10 ENCOUNTER — Encounter: Payer: Self-pay | Admitting: Physical Therapy

## 2024-02-10 DIAGNOSIS — M6281 Muscle weakness (generalized): Secondary | ICD-10-CM | POA: Diagnosis not present

## 2024-02-10 DIAGNOSIS — R2681 Unsteadiness on feet: Secondary | ICD-10-CM

## 2024-02-10 NOTE — Therapy (Signed)
 OUTPATIENT PHYSICAL THERAPY THORACOLUMBAR TREATMENT   Patient Name: Charles Barrera MRN: [id number] DOB:[birth year], 43 y.o., male Today's Date: 02/10/2024  END OF SESSION:  PT End of Session - 02/10/24 1017     Visit Number 13    Date for PT Re-Evaluation 02/17/24    Authorization Type BCBS    PT Start Time 1018    PT Stop Time 1059    PT Time Calculation (min) 41 min    Activity Tolerance Patient tolerated treatment well              History reviewed. No pertinent past medical history. Past Surgical History:  Procedure Laterality Date   FLEXIBLE SIGMOIDOSCOPY N/A 10/27/2023   Procedure: FLEXIBLE SIGMOIDOSCOPY;  Surgeon: Jeani Hawking, MD;  Location: [facility] ENDOSCOPY;  Service: Gastroenterology;  Laterality: N/A;   LAMINECTOMY N/A 10/25/2023   Procedure: Thoracic four-thoracic six laminectomy for resection of epidural mass;  Surgeon: Bedelia Person, MD;  Location: [facility] OR;  Service: Neurosurgery;  Laterality: N/A;   Patient Active Problem List   Diagnosis Date Noted   Abnormal CT scan, colon 11/08/2023   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   Lytic bone lesions on xray 10/23/2023    PCP: Caffie Damme, MD   REFERRING PROVIDER: Bedelia Person, MD   REFERRING DIAG: [phone] (ICD-10-CM) - Spondylosis with myelopathy, thoracic region  Rationale for Evaluation and Treatment: Rehabilitation  THERAPY DIAG:  Muscle weakness (generalized)  Unsteadiness on feet  ONSET DATE: 10/25/23 DOS T4-6 laminectomy for resection of epidural mass  SUBJECTIVE:                                                                                                                                                                                           SUBJECTIVE STATEMENT: 02/10/2024 felt good after last session. Arrives w/o cane. No other new updates    PERTINENT HISTORY:  unremarkable  PAIN:  Are you having pain? Yes: NPRS scale: some back stiffness Pain location: mid and low  back Pain description: pressure Aggravating factors: prolonged standing Relieving factors: siting or changing positions  PRECAUTIONS: none  RED FLAGS: None   WEIGHT BEARING RESTRICTIONS: No  FALLS:  Has patient fallen in last 6 months? 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  DIAGNOSTIC FINDINGS:  12/05/23 MRI IMPRESSION: 1. 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 2. T2 hyperintensity in the cord at the level of prior surgery, likely post compressive. No residual cord mass effect. 3. Unchanged widespread marrow infiltration, correlate with pathology.  PATIENT SURVEYS:  Modified Oswestry 12 / 50 = 24.0 %   COGNITION: Overall cognitive status: Within functional limits for tasks assessed     SENSATION: Not full sensation back but better than prior to surgery  MUSCLE LENGTH: Quads L> R, marked HS, piriformis and gastroc B   POSTURE: rounded shoulders, forward head, and flexed trunk   PALPATION: Tender and tight in bil thoracic paraspinals around incision  LUMBAR ROM: mild tightness with rotation B  AROM eval  Flexion   Extension   Right lateral flexion   Left lateral flexion   Right rotation   Left rotation    (Blank rows = not tested)  LOWER EXTREMITY MMT:     MMT  Right eval Left eval  Hip flexion 4+ 4  Hip extension 5 4+  Hip abduction 4 4-  Hip adduction 5 5  Hip internal rotation    Hip external rotation    Knee flexion 4 5  Knee extension 5 5  Ankle  dorsiflexion 5 5  Ankle plantarflexion    Ankle inversion    Ankle eversion     (Blank rows = not tested)  LOWER EXTREMITY ROM:  WFL, but limited by flexibility deficits   FUNCTIONAL TESTS:  5 times sit to stand: 11.53 sec Berg Balance Scale: 50/56 Dynamic Gait Index: 22/24 on 12/26/23  GAIT: Distance walked: 20 Assistive device utilized: Single point cane Level of assistance: Modified independence Comments: slow gait, wide BOS  TREATMENT DATE:                                                                                                                               [facility] Adult PT Treatment:                                                DATE: 02/10/24 Therapeutic Exercise: Superset 2 bouts; lateral step down (4 inch) x8 BIL, hip hike/drop x8 BIL   Neuromuscular re-ed: 2x3 laps (CW and CCW) reactive gait (stop, go, slow, fast) Obstacle course 4 laps (airex balance beam, theraband pad, cone turn, fwd/retro walk around cone) 29sec, 26sec, 24sec, 24sec  Therapeutic Activity: 10# squat (quick up, slow down) 3x5 BW hinge x8 10# KB pickup from 8inch step 2x6 cues for hinge     [facility] Adult PT Treatment:  DATE: 02/05/24  Neuromuscular re-ed: Obstacle course (staircase, airex step over, 5 cone weaving) for postural stability. (26sec, 25 sec, 23sec, 22sec, 20sec) Lateral lunges x10 BIL cues for hip activation  4 inch runner step up x10 BIL  cues for single limb stability, inc hip activation on stance limb  Therapeutic Activity: 3 laps walking between exercises cues for weightshifting mechanics 5# suitcase carry 3 laps (52ft) BIL cues for posture Squat w/ 10# 2x8   [facility] Adult PT Treatment:                                                DATE: 02/03/24 Therapeutic Exercise: Superset; lateral step down and hip hike x8 BIL on 4 inch step; 2 bouts Lateral lunges x8 BIL HEP update + education  Neuro re-education Tandem weight shifts +  rocking x20 each Obstacle course (staircase, airex step over, 5 cone weaving) for postural stability. (35sec, 32sec, 29sec, 27sec, 24sec)  Therapeutic Activity: 2-3 laps walking between exercises w/ emphasis on weight shifting/mechanics    [facility] Adult PT Treatment:                                                DATE: 01/30/24  Neuromuscular re-ed: Obstacle course (staircase, airex step over, 5 cone weaving) for postural stability. 43sec;33sec; 32sec; 31sec; 29sec. 1 min rest between all sets Bosu lunge x10 RLE  Slow karaokes 3 laps (~20 feet each way) Lateral step up/down small step x12 BIL Therapeutic Activity: 2-3 laps walking between exercises 5# squat x8, 10# 2x8    PATIENT EDUCATION:  Education details: rationale for interventions, HEP  Person educated: Patient Education method: Explanation, Demonstration, Tactile cues, Verbal cues Education comprehension: verbalized understanding, returned demonstration, verbal cues required, tactile cues required, and needs further education     HOME EXERCISE PROGRAM: Access Code: [email or web address] URL: [email or web address] Date: 02/03/2024 Prepared by: Fransisco Hertz  Exercises - Step Taps on High Step  - 1 x daily - 3-4 x weekly - 1 sets - 10 reps - Squat  - 3-4 x weekly - 2-3 sets - 8 reps - Forward Monster Walk with Resistance (BKA)  - 1 x daily - 3-4 x weekly - 2 sets - 10 reps - Side Stepping with Resistance at Thighs  - 1 x daily - 3-4 x weekly - 2 sets - 10 reps - Standing Hip Extension with Counter Support  - 1 x daily - 3-4 x weekly - 2-3 sets - 10 reps - Standing Hip Abduction with Counter Support  - 1 x daily - 3-4 x weekly - 2-3 sets - 10 reps - Walking Tandem Stance  - 3-4 x weekly - 1 sets - 10 reps - Carioca with Counter Support  - 3-4 x weekly - 2-3 sets - 5-8 reps - Stride Stance Weight Shift  - 2-3 x daily - 1 sets - 15 reps  ASSESSMENT:  CLINICAL IMPRESSION: 02/10/2024 Pt arrives w/ back stiffness, no issues  after last session. Today able to progress well with functional lifting and LE supersets. Also introducing new obstacle course working on altered BOS, change of pace. Also working on reactive change of pace activities. Pt tolerates session well with report of fatigue, no adverse events. Recommend  continuing along current POC in order to address relevant deficits and improve functional tolerance. Pt departs today's session in no acute distress, all voiced questions/concerns addressed appropriately from PT perspective.     Eval: Patient is a 43 y.o. male who was seen today for physical therapy evaluation and treatment for thoracic spine pain and LE weakness s/p T4-6 laminectomy due to epidural mass. He also reports balance deficits and has been using a cane since October. All tests have been negative for cancer. He has a bone marrow test scheduled for early January. He demonstrates B LE weakness, marked flexibility deficits and higher level balance deficits. His BERG score of 50/56 puts him at moderate risk for falls. No gait belt required today and no incidences of LOB with gait. He will benefit from skilled PT to address these deficits.    OBJECTIVE IMPAIRMENTS: decreased activity tolerance, decreased balance, difficulty walking, decreased ROM, decreased strength, increased muscle spasms, impaired flexibility, postural dysfunction, and pain.   ACTIVITY LIMITATIONS: carrying, lifting, bending, standing, stairs, and locomotion level  PARTICIPATION LIMITATIONS: cleaning, shopping, community activity, occupation, and yard work  PERSONAL FACTORS: Fitness, Profession, and 1 comorbidity: myelopathy  are also affecting patient's functional outcome.   REHAB POTENTIAL: Good  CLINICAL DECISION MAKING: Evolving/moderate complexity  EVALUATION COMPLEXITY: Moderate   GOALS: Goals reviewed with patient? Yes  SHORT TERM GOALS: Target date: 01/13/2024   Patient will be independent with initial HEP. Baseline:   01/14/24: reports good HEP performance Goal status: MET   2. DGI completed to assess fall risk. Baseline:  Goal status: MET   LONG TERM GOALS: Target date: 02/17/2024  Patient will be independent with advanced/ongoing HEP to improve outcomes and carryover.  Baseline:  Goal status: INITIAL  2.  Patient will be able to ambulate 600' in 6 minutes without AD with good safety to access community.  Baseline:  Goal status: INITIAL  3.  Patient will be able to step up/down curb safely without AD with good safety.  Baseline:  Goal status: INITIAL   4.  Patient will demonstrate decreased 5XSTS by 2-3 seconds showing improved functional strength. Baseline: 11.53 sec Goal status: INITIAL  5.  Patient will demonstrate at least 19/24 on DGI to improve gait stability and reduce risk for falls. Baseline: TBD Goal status: MET  6.  Patient will score 56 on Berg Balance test to demonstrate lower risk of falls. (MCID= 8 points) .  Baseline: 50 Goal status: INITIAL  7.  Patient will report 6 on Modified Oswestry to demonstrate improved functional ability. Baseline: 12 / 50 = 24.0 % Goal status: INITIAL  8.  Patient will report decreased back pain by 75% to improved QOL. Baseline:  Goal status: INITIAL    PLAN:  PT FREQUENCY: 2x/week  PT DURATION: 8 weeks  PLANNED INTERVENTIONS: 97164- PT Re-evaluation, 97110-Therapeutic exercises, 97530- Therapeutic activity, 97112- Neuromuscular re-education, 97535- Self Care, 16109- Manual therapy, [phone]- Gait training, [phone]- Aquatic Therapy, [phone]- Electrical stimulation (unattended), Patient/Family education, Balance training, Stair training, Taping, Dry Needling, Joint mobilization, Spinal mobilization, Scar mobilization, Cryotherapy, and Moist heat.  PLAN FOR NEXT SESSION: continue working on multiplanar postural stability. LE resisted exercises w/ emphasis on muscular strength/endurance.    Ashley Murrain PT, DPT 02/10/2024 11:03 AM

## 2024-02-12 ENCOUNTER — Ambulatory Visit: Payer: 59 | Admitting: Physical Therapy

## 2024-02-20 ENCOUNTER — Ambulatory Visit: Payer: 59 | Admitting: Physical Therapy

## 2024-02-20 ENCOUNTER — Encounter: Payer: Self-pay | Admitting: Physical Therapy

## 2024-02-20 DIAGNOSIS — R2681 Unsteadiness on feet: Secondary | ICD-10-CM

## 2024-02-20 DIAGNOSIS — M6281 Muscle weakness (generalized): Secondary | ICD-10-CM | POA: Diagnosis not present

## 2024-02-20 DIAGNOSIS — M546 Pain in thoracic spine: Secondary | ICD-10-CM

## 2024-02-20 NOTE — Therapy (Signed)
 OUTPATIENT PHYSICAL THERAPY RECERTIFICATION + DISCHARGE SUMMARY    Patient Name: Charles Barrera MRN: [id number] DOB:Jun 06, 1981, 43 y.o., male Today's Date: 02/20/2024   PHYSICAL THERAPY DISCHARGE SUMMARY  Visits from Start of Care: 14  Current functional level related to goals / functional outcomes: Some limitations in functional mobility due to fatigue   Remaining deficits: Fatigue, mild balance deficits   Education / Equipment: HEP, discharge education, follow up with provider   Patient agrees to discharge. Patient goals were met. Patient is being discharged due to meeting the stated rehab goals.   END OF SESSION:  PT End of Session - 02/20/24 0930     Visit Number 14    Authorization Type BCBS    PT Start Time 0930    PT Stop Time 1010    PT Time Calculation (min) 40 min    Activity Tolerance Patient tolerated treatment well               History reviewed. No pertinent past medical history. Past Surgical History:  Procedure Laterality Date   FLEXIBLE SIGMOIDOSCOPY N/A 10/27/2023   Procedure: FLEXIBLE SIGMOIDOSCOPY;  Surgeon: Jeani Hawking, MD;  Location: [facility] ENDOSCOPY;  Service: Gastroenterology;  Laterality: N/A;   LAMINECTOMY N/A 10/25/2023   Procedure: Thoracic four-thoracic six laminectomy for resection of epidural mass;  Surgeon: Bedelia Person, MD;  Location: [facility] OR;  Service: Neurosurgery;  Laterality: N/A;   Patient Active Problem List   Diagnosis Date Noted   Abnormal CT scan, colon 11/08/2023   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   Lytic bone lesions on xray 10/23/2023    PCP: Caffie Damme, MD   REFERRING PROVIDER: Bedelia Person, MD   REFERRING DIAG: [phone] (ICD-10-CM) - Spondylosis with myelopathy, thoracic region  Rationale for Evaluation and Treatment: Rehabilitation  THERAPY DIAG:  Muscle weakness (generalized)  Unsteadiness on feet  Pain in thoracic spine  ONSET DATE: 10/25/23 DOS T4-6 laminectomy for resection of  epidural mass  SUBJECTIVE:                                                                                                                                                                                           SUBJECTIVE STATEMENT: 02/20/2024 has been doing well overall, using cane less. Reports balance getting better, still has to take rest breaks at times due to muscular fatigue. States he feels ready to discharge today.     PERTINENT HISTORY:  unremarkable  PAIN:  Are you having pain? Yes: NPRS scale: some back stiffness Pain location: mid and low back Pain description: pressure Aggravating factors: prolonged standing Relieving factors: siting or changing positions  PRECAUTIONS: none  RED FLAGS: None   WEIGHT BEARING RESTRICTIONS: No  FALLS:  Has patient fallen in last 6 months? 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  DIAGNOSTIC FINDINGS:  12/05/23 MRI IMPRESSION: 1. 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 2. T2 hyperintensity in the cord at the level of prior surgery, likely post compressive. No residual cord mass effect. 3. Unchanged widespread marrow infiltration, correlate with pathology.  PATIENT SURVEYS:  Modified Oswestry 12 / 50 = 24.0 %  Modified Oswestry 02/20/24: 5/50 = 10%   COGNITION: Overall cognitive status: Within functional limits for tasks assessed     SENSATION: Not full sensation back but better than prior to surgery  MUSCLE LENGTH: Quads L> R,  marked HS, piriformis and gastroc B   POSTURE: rounded shoulders, forward head, and flexed trunk   PALPATION: Tender and tight in bil thoracic paraspinals around incision  LUMBAR ROM: mild tightness with rotation B  AROM eval  Flexion   Extension   Right lateral flexion   Left lateral flexion   Right rotation   Left rotation    (Blank rows = not tested)  LOWER EXTREMITY MMT:     MMT  Right eval Left eval  Hip flexion 4+ 4  Hip extension 5 4+  Hip abduction 4 4-  Hip adduction 5 5  Hip internal rotation    Hip external rotation    Knee flexion 4 5  Knee extension 5 5  Ankle dorsiflexion 5 5  Ankle plantarflexion    Ankle inversion    Ankle eversion     (Blank rows = not tested)  LOWER EXTREMITY ROM:  WFL, but limited by flexibility deficits   FUNCTIONAL TESTS:  5 times sit to stand: 11.53 sec Berg Balance Scale: 50/56 Dynamic Gait Index: 22/24 on 12/26/23   02/20/24:  5xSTS 8 sec BERG 56/56 1336ft no AD  GAIT: Distance walked: 20 Assistive device utilized: Single point cane Level of assistance: Modified independence Comments: slow gait, wide BOS  TREATMENT DATE:                                                                                                                               [facility] Adult PT Treatment:  DATE: 02/20/24 Therapeutic Activity: + education 5xSTS + education BERG + education Education/discussion re: progress with PT, symptom behavior as it affects activity tolerance, PT goals/POC, discharge planning, follow up with provider  Self Care: Education on gradual progression of activities with respect to symptom tolerance, injury risk reduction, communication with provider as needed    PATIENT EDUCATION:  Education details: PT POC, PT goals, progress with PT thus far, discharge planning, HEP, follow up with provider Person educated: Patient Education method: Explanation,  Demonstration, Verbal cues Education comprehension: verbalized understanding, returned demonstration   HOME EXERCISE PROGRAM: Access Code: [email or web address] URL: [email or web address] Date: 02/03/2024 Prepared by: Fransisco Hertz  Exercises - Step Taps on High Step  - 1 x daily - 3-4 x weekly - 1 sets - 10 reps - Squat  - 3-4 x weekly - 2-3 sets - 8 reps - Forward Monster Walk with Resistance (BKA)  - 1 x daily - 3-4 x weekly - 2 sets - 10 reps - Side Stepping with Resistance at Thighs  - 1 x daily - 3-4 x weekly - 2 sets - 10 reps - Standing Hip Extension with Counter Support  - 1 x daily - 3-4 x weekly - 2-3 sets - 10 reps - Standing Hip Abduction with Counter Support  - 1 x daily - 3-4 x weekly - 2-3 sets - 10 reps - Walking Tandem Stance  - 3-4 x weekly - 1 sets - 10 reps - Carioca with Counter Support  - 3-4 x weekly - 2-3 sets - 5-8 reps - Stride Stance Weight Shift  - 2-3 x daily - 1 sets - 15 reps  ASSESSMENT:  CLINICAL IMPRESSION: 02/20/2024 Pt arrives w/ report of fatigue after increased time on feet yesterday. Overall reports excellent progress compared to start of care, minimal cane use and improved strength in legs although continues with mild deficits when more fatigued. Has met all LTG as noted below, 56/56 on BERG, able to ambulate >1370ft in 6 min w/o AD. He denies any overt limitations in usual activities although does require rest breaks/pacing at times. Tolerates session well with some fatigue. Education is provided on gradual progression of activity with monitoring of symptoms, modifications as needed, strategies to minimize injury risk. No adverse events, pt states he feels confident discharging at this time. Pt departs today's session in no acute distress, all voiced questions/concerns addressed appropriately from PT perspective.     Eval: Patient is a 43 y.o. male who was seen today for physical therapy evaluation and treatment for thoracic spine pain and LE  weakness s/p T4-6 laminectomy due to epidural mass. He also reports balance deficits and has been using a cane since October. All tests have been negative for cancer. He has a bone marrow test scheduled for early January. He demonstrates B LE weakness, marked flexibility deficits and higher level balance deficits. His BERG score of 50/56 puts him at moderate risk for falls. No gait belt required today and no incidences of LOB with gait. He will benefit from skilled PT to address these deficits.    OBJECTIVE IMPAIRMENTS: decreased activity tolerance, decreased balance, difficulty walking, decreased ROM, decreased strength, increased muscle spasms, impaired flexibility, postural dysfunction, and pain.   ACTIVITY LIMITATIONS: carrying, lifting, bending, standing, stairs, and locomotion level  PARTICIPATION LIMITATIONS: cleaning, shopping, community activity, occupation, and yard work  PERSONAL FACTORS: Fitness, Profession, and 1 comorbidity: myelopathy  are also affecting patient's functional outcome.   REHAB POTENTIAL: Good  CLINICAL DECISION MAKING: Evolving/moderate  complexity  EVALUATION COMPLEXITY: Moderate   GOALS: Goals reviewed with patient? Yes  SHORT TERM GOALS: Target date: 01/13/2024   Patient will be independent with initial HEP. Baseline:  01/14/24: reports good HEP performance Goal status: MET   2. DGI completed to assess fall risk. Baseline:  Goal status: MET   LONG TERM GOALS: Target date: 02/17/2024  Patient will be independent with advanced/ongoing HEP to improve outcomes and carryover.  Baseline:  02/20/24: reports good ability to perform HEP Goal status: MET  2.  Patient will be able to ambulate 600' in 6 minutes without AD with good safety to access community.  Baseline:  02/20/24: 1320 ft in 6 min no AD, some low back fatigue Goal status: MET  3.  Patient will be able to step up/down curb safely without AD with good safety.  Baseline:  02/20/24: able to  perform without issue Goal status: MET  4.  Patient will demonstrate decreased 5XSTS by 2-3 seconds showing improved functional strength. Baseline: 11.53 sec 02/20/24: 8.14sec  Goal status: MET  5.  Patient will demonstrate at least 19/24 on DGI to improve gait stability and reduce risk for falls. Baseline: TBD Goal status: MET  6.  Patient will score 56 on Berg Balance test to demonstrate lower risk of falls. (MCID= 8 points) .  Baseline: 50 02/20/24: 56 Goal status: MET  7.  Patient will report 6 on Modified Oswestry to demonstrate improved functional ability. Baseline: 12 / 50 = 24.0 % 02/20/24: 5 / 50 = 10% Goal status: MET  8.  Patient will report decreased back pain by 75% to improved QOL. Baseline:  02/20/24: pt reports 80% improvement compared to start of care Goal status: MET    PLAN: DISCHARGE 02/20/24  PT FREQUENCY: NA  PT DURATION: NA  PLANNED INTERVENTIONS: NA  PLAN FOR NEXT SESSION: discharge to independent HEP, follow up with provider as needed    Ashley Murrain PT, DPT 02/20/2024 10:11 AM

## 2024-05-21 ENCOUNTER — Telehealth: Payer: Self-pay | Admitting: Oncology

## 2024-05-21 ENCOUNTER — Other Ambulatory Visit: Payer: Self-pay

## 2024-05-21 NOTE — Telephone Encounter (Signed)
 Patient has been scheduled for follow-up visit per 05/21/24 LOS.  LVM notifying pt of appt details, provided my direct number to pt if appt changes need to be made.

## 2024-05-26 ENCOUNTER — Telehealth: Payer: Self-pay | Admitting: Oncology

## 2024-05-26 ENCOUNTER — Telehealth: Payer: Self-pay

## 2024-05-26 NOTE — Telephone Encounter (Signed)
 Left a telephone voice mail on patient's phone to remind him that he has a couple of scans that need to be scheduled this month as indicated in patient's chart by Dr. Randye Buttner. Also spoke with the scheduling dept who informed per their records that the patient was attempted to be reached x3 by their dept to schedule these scans with voicemails left each time. No return calls noted from patient. The third attempt included significant other with voicemail left to ask pt to call scheduling. Dr. Randye Buttner aware.

## 2024-05-26 NOTE — Telephone Encounter (Signed)
 05/26/24~LVM on patient's vm & also his Emergency Contact aka S/O Charles Barrera. Patient also was called & unavailable on 05/21/24 & 05/25/24 as well & VM was left for patient. MF

## 2024-05-28 ENCOUNTER — Telehealth: Payer: Self-pay

## 2024-05-30 ENCOUNTER — Ambulatory Visit (HOSPITAL_COMMUNITY)
Admission: RE | Admit: 2024-05-30 | Discharge: 2024-05-30 | Disposition: A | Discharge status: Home / Self Care | Source: Ambulatory Visit | Attending: Oncology

## 2024-05-30 DIAGNOSIS — M898X9 Other specified disorders of bone, unspecified site: Secondary | ICD-10-CM

## 2024-05-30 MED ORDER — GADOBUTROL 1 MMOL/ML IV SOLN
10.0000 mL | Freq: Once | INTRAVENOUS | Status: AC | PRN
  Administered 2024-05-30: 10 mL via INTRAVENOUS

## 2024-06-09 ENCOUNTER — Other Ambulatory Visit: Payer: Self-pay | Admitting: Oncology

## 2024-06-09 DIAGNOSIS — M898X9 Other specified disorders of bone, unspecified site: Secondary | ICD-10-CM

## 2024-06-11 NOTE — Telephone Encounter (Signed)
 Opened in error

## 2024-06-15 ENCOUNTER — Inpatient Hospital Stay (HOSPITAL_BASED_OUTPATIENT_CLINIC_OR_DEPARTMENT_OTHER): Admitting: Oncology

## 2024-06-15 ENCOUNTER — Encounter: Payer: Self-pay | Admitting: Oncology

## 2024-06-15 ENCOUNTER — Inpatient Hospital Stay: Attending: Oncology

## 2024-06-15 VITALS — BP 122/88 | HR 63 | Temp 98.1°F | Resp 16 | Ht 74.0 in | Wt 277.5 lb

## 2024-06-15 DIAGNOSIS — M899 Disorder of bone, unspecified: Secondary | ICD-10-CM | POA: Diagnosis present

## 2024-06-15 DIAGNOSIS — R933 Abnormal findings on diagnostic imaging of other parts of digestive tract: Secondary | ICD-10-CM | POA: Diagnosis not present

## 2024-06-15 DIAGNOSIS — Z79899 Other long term (current) drug therapy: Secondary | ICD-10-CM | POA: Insufficient documentation

## 2024-06-15 DIAGNOSIS — M898X9 Other specified disorders of bone, unspecified site: Secondary | ICD-10-CM | POA: Diagnosis not present

## 2024-06-15 LAB — CMP (CANCER CENTER ONLY)
ALT: 23 U/L (ref 0–44)
AST: 22 U/L (ref 15–41)
Albumin: 4.6 g/dL (ref 3.5–5.0)
Alkaline Phosphatase: 79 U/L (ref 38–126)
Anion gap: 12 (ref 5–15)
BUN: 10 mg/dL (ref 6–20)
CO2: 25 mmol/L (ref 22–32)
Calcium: 9.8 mg/dL (ref 8.9–10.3)
Chloride: 105 mmol/L (ref 98–111)
Creatinine: 1.07 mg/dL (ref 0.61–1.24)
GFR, Estimated: >60 mL/min (ref >60)
Glucose, Bld: 101 mg/dL — ABNORMAL HIGH (ref 70–99)
Potassium: 3.8 mmol/L (ref 3.5–5.1)
Sodium: 143 mmol/L (ref 135–145)
Total Bilirubin: 0.3 mg/dL (ref 0.0–1.2)
Total Protein: 7.3 g/dL (ref 6.5–8.1)

## 2024-06-15 LAB — CBC WITH DIFFERENTIAL (CANCER CENTER ONLY)
Abs Immature Granulocytes: 0.01 10*3/uL (ref 0.00–0.07)
Basophils Absolute: 0.1 10*3/uL (ref 0.0–0.1)
Basophils Relative: 1 %
Eosinophils Absolute: 0.1 10*3/uL (ref 0.0–0.5)
Eosinophils Relative: 1 %
HCT: 46.2 % (ref 39.0–52.0)
Hemoglobin: 15.1 g/dL (ref 13.0–17.0)
Immature Granulocytes: 0 %
Lymphocytes Relative: 37 %
Lymphs Abs: 2.4 10*3/uL (ref 0.7–4.0)
MCH: 27.8 pg (ref 26.0–34.0)
MCHC: 32.7 g/dL (ref 30.0–36.0)
MCV: 84.9 fL (ref 80.0–100.0)
Monocytes Absolute: 0.7 10*3/uL (ref 0.1–1.0)
Monocytes Relative: 11 %
Neutro Abs: 3.3 10*3/uL (ref 1.7–7.7)
Neutrophils Relative %: 50 %
Platelet Count: 230 10*3/uL (ref 150–400)
RBC: 5.44 MIL/uL (ref 4.22–5.81)
RDW: 13.9 % (ref 11.5–15.5)
WBC Count: 6.5 10*3/uL (ref 4.0–10.5)
nRBC: 0 % (ref 0.0–0.2)

## 2024-06-15 LAB — LACTATE DEHYDROGENASE: LDH: 189 U/L (ref 98–192)

## 2024-06-15 NOTE — Progress Notes (Signed)
 [facility]  ONCOLOGY CLINIC PROGRESS NOTE   Patient Care Team: Claudene Round, MD as PCP - General (Family Medicine) Debby Dorn MATSU, MD as Consulting Physician (Neurosurgery)  PATIENT NAME: Charles Barrera   MR#: [id number] DOB: 10/04/1981  Date of visit: 06/15/2024   ASSESSMENT & PLAN:   Charles Barrera is a 43 y.o. gentleman with no significant past medical history presented to the hospital in October 2024 for progressive weakness and walking difficulty. He began having mid back pain in February 2024, but in September, began having difficulty with leg weakness and walking. He had workup at [facility] and Dr. Marlyce ortho spine and numerous bone lesions were seen in his lumbar spine 09/28/23. By patient's report, there was concern for myeloma but labs were negative or inconclusive. He began having increased walking difficulty which prompted visit to [facility] ER.  Clinical picture was concerning for metastatic disease to the bones but workup so far showed no signs of malignancy.   Lytic bone lesions on xray - Please review oncology history for additional details and timeline of events and past workup.  Pathology from epidural mass/laminectomy showed overall benign findings with some atypical lymphoid infiltrate. CT scan showed no lymph node enlargement but did show a concerning area in the colon. Patient reports weakness in legs and core, but strength is improving. No bowel issues, weight loss, or other systemic symptoms reported.  PET scan on 11/19/2023 showed postsurgical changes from recent thoracic spine laminectomies and tumor resection.  Associated expected hypermetabolism in the surgical bed.  Diffuse mixed lytic and sclerotic osseous process with areas of moderate hypermetabolism with SUV max of 5.32.  Findings likely primary osseous neoplastic process such as lymphoma or myeloma.  No findings for extraosseous primary neoplastic process or metastatic  disease.  -Given CT findings, PET scan and MRI findings, there was still clinical concern for metastatic malignancy of unknown primary.  Lymphomatous process could not be definitively ruled out.  Workup for monoclonal gammopathy came back negative.  -Since patient's PET scan could not give us  a definitive answer regarding a primary, we proceeded with bone marrow biopsy/aspiration on 12/31/2023.  Bone marrow biopsy showed overall normocellular to mildly hypocellular bone marrow with a mild reversed M : E ratio with otherwise orderly trilineage hematopoiesis.  A few reactive lymphoid aggregates.  Flow cytometry was unremarkable.  No evidence of lymphoma, myeloma or other malignancy.  His case was discussed in neuro/spine conference on 01/06/2024.  Consensus opinion was to observe with repeat imaging in 6 months.  Patient has improved clinically.  No longer has any lower extremity weakness.  He is able to work without limitations.  Ambulating without assistance of cane.  Repeat MRI of thoracic and lumbar spine from 05/30/2024 showed no new findings.  Stable diffuse bone marrow enhancement.  I called patient with results.  CBCD, CMP, LDH are all unremarkable today.  Patient was provided reassurance.  -Continue Pregabalin  for neuropathic pain.  -Follow-up with neurosurgeon, Dr. Debby.   RTC in 6 months for follow-up with repeat labs.  In the future, we will repeat imaging depending on clinical status and labs.  Abnormal CT scan, colon Previous CT scan of abdomen on 10/25/2023 showed potential stricture in the colon. No current symptoms reported. -He had sigmoidoscopy on 11/07/2023, performed by Dr. Rollin.  No abnormalities noted.    I reviewed lab results and outside records for this visit and discussed relevant results with the patient. Diagnosis, plan of care  and treatment options were also discussed in detail with the patient. Opportunity provided to ask questions and answers provided to his  apparent satisfaction. Provided instructions to call our clinic with any problems, questions or concerns prior to return visit. I recommended to continue follow-up with PCP and sub-specialists. He verbalized understanding and agreed with the plan.   NCCN guidelines have been consulted in the planning of this patient's care.  I spent a total of 30 minutes during this encounter with the patient including review of chart and various tests results, discussions about plan of care and coordination of care plan.   [name], MD  06/15/2024 1:59 PM  [facility] - A DEPT OF [facility] [address] [place] [address] Dept: [phone] Dept Fax: [phone]    CHIEF COMPLAINT/ REASON FOR VISIT:   Lytic lesions on imaging.  Extensive workup including bone marrow biopsy, biopsy of bone lesions have not shown any evidence of malignancy.  INTERVAL HISTORY:    Discussed the use of AI scribe software for clinical note transcription with the patient, who gave verbal consent to proceed.  History of Present Illness Charles Barrera is a 43 year old male who presents for follow-up and evaluation of bone changes.  He is progressing well after his back surgery, though not yet at full recovery. He can walk without support, previously requiring a cane. He experiences a different sensation in his back, likely due to the surgical site, but no significant pain. He reports tingling and a feeling of a knot on the right side of his back.  He has maintained his weight and reports a stable appetite. He has undergone extensive workup including scans, a bone marrow biopsy, and a biopsy of spots. He has not seen his neurosurgeon since February, with no additional recommendations at that time.  He has returned to work without limitations. He underwent a sigmoidoscopy following his back surgery, but no full colonoscopy was  performed.  No issues with bowel or bladder control, burning during urination, or blood in stools.    I have reviewed the past medical history, past surgical history, social history and family history with the patient and they are unchanged from previous note.  HISTORY OF PRESENT ILLNESS:    Oncology History  Lytic bone lesions on xray  10/23/2023 Initial Diagnosis   Patient with no significant past medical history presented to the hospital on 10/23/23 for progressive weakness and walking difficulty. He began having mid back pain in February, but in September 2024, began having difficulty with leg weakness and walking. He had workup at [facility] and Dr. Marlyce ortho spine and numerous bone lesions were seen in his lumbar spine 10/5. By patient's report, there was concern for myeloma but labs were negative or inconclusive. He began having increased walking difficulty which prompted visit to [facility] ER. He has been walking with a cane but feels like the last few weeks he has gotten worse to the point he may need a wheelchair. He has numbness in his legs and in his trunk from the chest down.Concurrently, he has been experiencing a sensation of numbness in his stomach, which he first noticed approximately two to three weeks ago. The patient denies any other symptoms such as blood in stools, changes in appetite or weight, nausea, vomiting, heartburn, acid reflux, scrotal swelling, or abdominal pain. He has no known family history of cancer and does not smoke. The patient  has sought medical attention for these symptoms since September, undergoing an MRI and CT scan, but the cause of his symptoms remains undetermined.   No family hx of blood cancers/dyscrasias, cancers. He does not smoke.    In the ED, MRI of the thoracic and lumbar spine was concerning for metastatic disease involving nearly all vertebral bodies, sacrum, and iliac bones with extraosseous mass extending from T4-T6 and  displacing the spinal cord.    With these findings, neurosurgery and oncology service was consulted for further recommendations.   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  No evidence of regional pathologic fracture.  Extraosseous mass in the posterior right side of the spinal canal extending from mid T4 to mid T6.  This displaced spinal cord and could be compressing the cord.  MRI of the lumbar spine: Metastatic disease affecting all the vertebral bodies throughout the region as well as the sacrum and iliac bones.  No evidence of regional pathologic fracture.  No extraosseous tumor in this region.  Mild noncompressive disc bulges at L4-5 and L5-S1.   10/25/2023 Surgery   Given high-grade cord compression, decision made to resect the epidural tumor which would serve both diagnostic and therapeutic benefits.   On 10/25/2023, Dr. Dorn Ned, neurosurgery performed thoracic 4, thoracic 5 and thoracic 6 laminectomy, right medial facetectomies, resection of epidural mass T4-5-6.  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  T5 nerve root was enveloped with tumor, small residual tumor left around nerve root and thin layer in right ventral epidural space.   10/25/2023 Pathology Results   Pathology from epidural mass showed dense hyalinized fibrovascular stroma with an atypical lymphoid infiltrate.  Thoracic spinous process and lamina resection showed benign cortical and trabecular bone with associated dense fibroconnective tissue and skeletal muscle.  Unremarkable marrow.  Immunohistochemical stains reveal the infiltrate is comprised of a mixture of CD20 positive B lymphocytes and CD3 positive T lymphocytes.  There appear to be more CD20 positive B lymphocytes than CD3 positive T lymphocytes.  The B lymphocytes are positive for seen PAX5 and CD79a.  They are negative for  CD10, BCL6 and cyclin D1.  While there appears to be focal CD5 expression, interpretation is limited by the large number of background T lymphocytes.  Bcl-2 and CD43 highlight the T lymphocytes.  Ki-67 proliferation index is low approximately 5%.  Other pertinent negative stains include EBV ISH, kappa/lambda ISH, MUM1-1, and CD30.  CD138 highlights rare plasma cells.  CD21 and CD23 highlight scattered cells and follicular dendritic meshwork.  Flow cytometric analysis does not reveal any evidence of a clonal B-cell population.  While the patient's history of vertebral lesions is noted, no definitive features diagnostic of lymphoma are identified in this specimen, and sampling artifact cannot be excluded.  A low-grade B-cell lymphoma FISH panel will be sent for further characterization and will be reported in an addendum.    10/25/2023 Tumor Marker   CEA, PSA, AFP, beta-hCG, LDH, beta-2 microglobulin were all within normal limits.  SPEP, IFE were unremarkable and showed no monoclonal protein.  Serum free kappa and lambda were within normal limits and ratio was normal at 0.96.  No evidence of anemia, renal dysfunction or hypercalcemia.   10/25/2023 Imaging   CT chest abdomen and pelvis with contrast: Diffuse heterogeneous bone sclerosis, likely indicating metastatic disease or myeloma.  Left paraspinal mass extending from T4-T8 level.  Focal narrowing of  the colon at the rectosigmoid junction, possibly mass, stricture or muscular hypertrophy.  Consider endoscopy if clinically indicated.   11/07/2023 Miscellaneous   Pathology from epidural mass/laminectomy showed benign findings with some atypical lymphoid infiltrate.  Clinical picture remains concerning for malignancy/lymphoma.  Request submitted for PET scan for further evaluation.  Plan to rebiopsy the most FDG avid lesion that is accessible.   11/19/2023 PET scan   PET scan showed postsurgical changes from recent thoracic spine laminectomies and tumor  resection.  Associated expected hypermetabolism in the surgical bed.  Diffuse mixed lytic and sclerotic osseous process with areas of moderate hypermetabolism with SUV max of 5.32.  Findings likely primary osseous neoplastic process such as lymphoma or myeloma.  No findings for extraosseous primary neoplastic process or metastatic disease.   12/06/2023 Miscellaneous   Plan made to proceed with bone marrow biopsy and aspiration for further evaluation to rule out lymphoma or nonsecretory myeloma.   12/31/2023 Bone Marrow Biopsy   Overall normocellular to mildly hypocellular bone marrow with a mild reversed M : E ratio with otherwise orderly trilineage hematopoiesis.  A few reactive lymphoid aggregates.  Flow cytometry was unremarkable.  No evidence of lymphoma, myeloma or other malignancy.   01/06/2024 Miscellaneous   His case was discussed in neuro/spine conference on 01/06/2024.  Consensus opinion was to observe with repeat imaging in 6 months.   05/30/2024 Imaging   MRI thoracic and lumbar spine: Stable findings without any new lesions.       REVIEW OF SYSTEMS:   Review of Systems - Oncology  All other pertinent systems were reviewed with the patient and are negative.  ALLERGIES: He has no known allergies.  MEDICATIONS:  Current Outpatient Medications  Medication Sig Dispense Refill   oxyCODONE  (OXY IR/ROXICODONE ) 5 MG immediate release tablet Take 1 tablet (5 mg total) by mouth every 6 (six) hours as needed for moderate pain (pain score 4-6). (Patient not taking: Reported on 06/15/2024) 30 tablet 0   pregabalin  (LYRICA ) 150 MG capsule Take 150 mg by mouth 2 (two) times daily. (Patient not taking: Reported on 06/15/2024)     No current facility-administered medications for this visit.     VITALS:   Blood pressure 122/88, pulse 63, temperature 98.1 F (36.7 C), temperature source Temporal, resp. rate 16, height 6' 2 (1.88 m), weight 277 lb 8 oz (125.9 kg), SpO2 100%.  Wt Readings from  Last 3 Encounters:  06/15/24 277 lb 8 oz (125.9 kg)  12/31/23 265 lb (120.2 kg)  11/07/23 272 lb 1.6 oz (123.4 kg)    Body mass index is 35.63 kg/m.    Onc Performance Status - 06/15/24 0800       ECOG Perf Status   ECOG Perf Status Fully active, able to carry on all pre-disease performance without restriction      KPS SCALE   KPS % SCORE Normal, no compliants, no evidence of disease          PHYSICAL EXAM:   Physical Exam Constitutional:      General: He is not in acute distress.    Appearance: Normal appearance.  HENT:     Head: Normocephalic and atraumatic.   Eyes:     Conjunctiva/sclera: Conjunctivae normal.    Cardiovascular:     Rate and Rhythm: Normal rate and regular rhythm.     Heart sounds: Normal heart sounds.  Pulmonary:     Effort: Pulmonary effort is normal. No respiratory distress.     Breath sounds: Normal  breath sounds.  Abdominal:     General: There is no distension.   Musculoskeletal:        General: No tenderness.     Right lower leg: No edema.     Left lower leg: No edema.   Neurological:     General: No focal deficit present.     Mental Status: He is alert and oriented to person, place, and time.   Psychiatric:        Mood and Affect: Mood normal.        Behavior: Behavior normal.        LABORATORY DATA:   I have reviewed the data as listed.  Results for orders placed or performed in visit on 06/15/24  Lactate dehydrogenase  Result Value Ref Range   LDH 189 98 - 192 U/L  CMP (Cancer Center only)  Result Value Ref Range   Sodium 143 135 - 145 mmol/L   Potassium 3.8 3.5 - 5.1 mmol/L   Chloride 105 98 - 111 mmol/L   CO2 25 22 - 32 mmol/L   Glucose, Bld 101 (H) 70 - 99 mg/dL   BUN 10 6 - 20 mg/dL   Creatinine 8.92 9.38 - 1.24 mg/dL   Calcium 9.8 8.9 - 89.6 mg/dL   Total Protein 7.3 6.5 - 8.1 g/dL   Albumin  4.6 3.5 - 5.0 g/dL   AST 22 15 - 41 U/L   ALT 23 0 - 44 U/L   Alkaline Phosphatase 79 38 - 126 U/L   Total  Bilirubin 0.3 0.0 - 1.2 mg/dL   GFR, Estimated >39 >39 mL/min   Anion gap 12 5 - 15  CBC with Differential (Cancer Center Only)  Result Value Ref Range   WBC Count 6.5 4.0 - 10.5 K/uL   RBC 5.44 4.22 - 5.81 MIL/uL   Hemoglobin 15.1 13.0 - 17.0 g/dL   HCT 53.7 60.9 - 47.9 %   MCV 84.9 80.0 - 100.0 fL   MCH 27.8 26.0 - 34.0 pg   MCHC 32.7 30.0 - 36.0 g/dL   RDW 86.0 88.4 - 84.4 %   Platelet Count 230 150 - 400 K/uL   nRBC 0.0 0.0 - 0.2 %   Neutrophils Relative % 50 %   Neutro Abs 3.3 1.7 - 7.7 K/uL   Lymphocytes Relative 37 %   Lymphs Abs 2.4 0.7 - 4.0 K/uL   Monocytes Relative 11 %   Monocytes Absolute 0.7 0.1 - 1.0 K/uL   Eosinophils Relative 1 %   Eosinophils Absolute 0.1 0.0 - 0.5 K/uL   Basophils Relative 1 %   Basophils Absolute 0.1 0.0 - 0.1 K/uL   Immature Granulocytes 0 %   Abs Immature Granulocytes 0.01 0.00 - 0.07 K/uL      RADIOGRAPHIC STUDIES:  I have personally reviewed the radiological images as listed and agree with the findings in the report.  MR THORACIC SPINE W WO CONTRAST Result Date: 06/15/2024 EXAM: MRI THORACIC SPINE WITH AND WITHOUT INTRAVENOUS CONTRAST 05/30/2024 11:42:01 AM TECHNIQUE: Multiplanar multisequence MRI of the thoracic spine was performed with and without the administration of intravenous contrast. COMPARISON: MRI of the thoracic spine dated 10/23/2023. CLINICAL HISTORY: Bone mass or bone pain, thoracic spine, aggressive features on xray; Multiple lytic lesions in the spine, needs surveillance. Bone mass or bone pain, lumbar spine, aggressive features on xray; Multiple lytic lesions in the spine, needs surveillance. FINDINGS: BONES AND ALIGNMENT: Vertebral body heights are maintained. Subtle enhancement of the vertebral bodies is  similar to the prior study. SPINAL CORD: The patient is status post laminectomy at T4 and T5. The posterior epidural lesion was resected. Residual enhancement is present in the right T5-6 foramen. No recurrent  posteriorly enhancing mass lesion is present. SOFT TISSUES: Expected enhancement is present within the surgical resection site. DEGENERATIVE CHANGES: No significant disc disease or central canal stenosis is present. The foramina are otherwise patent bilaterally. IMPRESSION: 1. Status post laminectomy at T4 and T5 with resection of the posterior epidural lesion. Residual enhancement is present in the right T5-6 foramen. No recurrent posteriorly enhancing mass lesion is present. 2. Diffuse marrow replacement and subtle enhancement of the vertebral bodies, similar to the prior study. Electronically signed by: Lonni Necessary MD 06/15/2024 09:11 AM EDT RP Workstation: [id number]   MR Lumbar Spine W Tommye Contrast Result Date: 06/15/2024 EXAM: MR Lumbar Spine with and without intravenous contrast. 05/30/2024 11:40:23 AM TECHNIQUE: Multiplanar multisequence MRI of the lumbar spine was performed with and without the administration of intravenous contrast. COMPARISON: MRI of the lumbar spine without and with contrast 10/23/2023. PET/CT scan 11/19/2023. CLINICAL HISTORY: Bone mass or bone pain, lumbar spine, aggressive features on xray; Multiple lytic lesions in the spine, needs surveillance. FINDINGS: BONES AND ALIGNMENT: Diffuse marrow replacement is present. Faint heterogeneous enhancement is present. The vertebral body heights are maintained. No acute or healing fractures are present. SPINAL CORD: The conus terminates normally. SOFT TISSUES: No acute abnormality. L1-L2: No disc herniation. No spinal canal stenosis or neural foraminal narrowing. L2-L3: No disc herniation. No spinal canal stenosis or neural foraminal narrowing. L3-L4: No disc herniation. No spinal canal stenosis or neural foraminal narrowing. L4-L5: Mild broad-based disc protrusion at L4-5 is stable without focal stenosis. L5-S1: A mild broad-based disc protrusion is present. No focal stenosis or change is present. IMPRESSION: 1. Diffuse marrow  replacement with faint heterogeneous enhancement, similar to the prior exam. 2. Mild broad-based disc protrusions at L4-5 and L5-S1, stable without focal stenosis. Electronically signed by: Lonni Necessary MD 06/15/2024 09:01 AM EDT RP Workstation: [id number]     CODE STATUS:  Code Status History     Date Active Date Inactive Code Status Order ID Comments User Context   12/31/2023 1008 01/01/2024 0518 Full Code [id number]  Hughes Simmonds, MD [facility]   10/23/2023 2205 10/28/2023 2301 Full Code [id number]  Charlton Evalene RAMAN, MD Inpatient    Questions for Most Recent Historical Code Status (Order [id number])     Question Answer   By: Consent: discussion documented in EHR            Orders Placed This Encounter  Procedures   CBC with Differential (Cancer Center Only)    Standing Status:   Future    Expected Date:   12/15/2024    Expiration Date:   03/15/2025   CMP (Cancer Center only)    Standing Status:   Future    Expected Date:   12/15/2024    Expiration Date:   03/15/2025   Lactate dehydrogenase    Standing Status:   Future    Expected Date:   12/15/2024    Expiration Date:   03/15/2025      This document was completed utilizing speech recognition software. Grammatical errors, random word insertions, pronoun errors, and incomplete sentences are an occasional consequence of this system due to software limitations, ambient noise, and hardware issues. Any formal questions or concerns about the content, text or information contained within the body of this dictation should be directly addressed to the  provider for clarification.

## 2024-06-15 NOTE — Assessment & Plan Note (Signed)
-   Please review oncology history for additional details and timeline of events and past workup.  Pathology from epidural mass/laminectomy showed overall benign findings with some atypical lymphoid infiltrate. CT scan showed no lymph node enlargement but did show a concerning area in the colon. Patient reports weakness in legs and core, but strength is improving. No bowel issues, weight loss, or other systemic symptoms reported.  PET scan on 11/19/2023 showed postsurgical changes from recent thoracic spine laminectomies and tumor resection.  Associated expected hypermetabolism in the surgical bed.  Diffuse mixed lytic and sclerotic osseous process with areas of moderate hypermetabolism with SUV max of 5.32.  Findings likely primary osseous neoplastic process such as lymphoma or myeloma.  No findings for extraosseous primary neoplastic process or metastatic disease.  -Given CT findings, PET scan and MRI findings, there was still clinical concern for metastatic malignancy of unknown primary.  Lymphomatous process could not be definitively ruled out.  Workup for monoclonal gammopathy came back negative.  -Since patient's PET scan could not give us  a definitive answer regarding a primary, we proceeded with bone marrow biopsy/aspiration on 12/31/2023.  Bone marrow biopsy showed overall normocellular to mildly hypocellular bone marrow with a mild reversed M : E ratio with otherwise orderly trilineage hematopoiesis.  A few reactive lymphoid aggregates.  Flow cytometry was unremarkable.  No evidence of lymphoma, myeloma or other malignancy.  His case was discussed in neuro/spine conference on 01/06/2024.  Consensus opinion was to observe with repeat imaging in 6 months.  Patient has improved clinically.  No longer has any lower extremity weakness.  He is able to work without limitations.  Ambulating without assistance of cane.  Repeat MRI of thoracic and lumbar spine from 05/30/2024 showed no new findings.  Stable  diffuse bone marrow enhancement.  I called patient with results.  CBCD, CMP, LDH are all unremarkable today.  Patient was provided reassurance.  -Continue Pregabalin  for neuropathic pain.  -Follow-up with neurosurgeon, Dr. Debby.   RTC in 6 months for follow-up with repeat labs.  In the future, we will repeat imaging depending on clinical status and labs.

## 2024-06-15 NOTE — Assessment & Plan Note (Signed)
 Previous CT scan of abdomen on 10/25/2023 showed potential stricture in the colon. No current symptoms reported. -He had sigmoidoscopy on 11/07/2023, performed by Dr. Elnoria Howard.  No abnormalities noted.

## 2024-06-25 ENCOUNTER — Other Ambulatory Visit: Payer: Self-pay

## 2024-10-19 ENCOUNTER — Other Ambulatory Visit: Payer: Self-pay | Admitting: Surgery

## 2024-12-14 ENCOUNTER — Telehealth: Payer: Self-pay | Admitting: Oncology

## 2024-12-15 ENCOUNTER — Ambulatory Visit: Admitting: Oncology

## 2024-12-15 ENCOUNTER — Other Ambulatory Visit
